# Patient Record
Sex: Male | Born: 1960
Health system: Southern US, Community
[De-identification: ages and names within clinical notes are randomized; demographics above are authoritative.]

## PROBLEM LIST (undated history)

## (undated) DIAGNOSIS — K219 Gastro-esophageal reflux disease without esophagitis: Secondary | ICD-10-CM

## (undated) DIAGNOSIS — N289 Disorder of kidney and ureter, unspecified: Secondary | ICD-10-CM

## (undated) HISTORY — PX: BACK SURGERY: SHX140

---

## 1997-08-04 ENCOUNTER — Inpatient Hospital Stay (HOSPITAL_COMMUNITY): Admission: RE | Admit: 1997-08-04 | Discharge: 1997-08-05 | Payer: Self-pay | Admitting: *Deleted

## 1999-06-20 ENCOUNTER — Encounter (INDEPENDENT_AMBULATORY_CARE_PROVIDER_SITE_OTHER): Payer: Self-pay | Admitting: Specialist

## 1999-06-20 ENCOUNTER — Ambulatory Visit (HOSPITAL_COMMUNITY): Admission: RE | Admit: 1999-06-20 | Discharge: 1999-06-20 | Payer: Self-pay | Admitting: Gastroenterology

## 2000-03-11 ENCOUNTER — Encounter: Admission: RE | Admit: 2000-03-11 | Discharge: 2000-03-11 | Payer: Self-pay | Admitting: General Surgery

## 2000-03-11 ENCOUNTER — Encounter: Payer: Self-pay | Admitting: General Surgery

## 2000-09-08 ENCOUNTER — Encounter: Payer: Self-pay | Admitting: Family Medicine

## 2000-09-08 ENCOUNTER — Encounter: Admission: RE | Admit: 2000-09-08 | Discharge: 2000-09-08 | Payer: Self-pay | Admitting: Family Medicine

## 2001-06-22 ENCOUNTER — Encounter: Payer: Self-pay | Admitting: Neurosurgery

## 2001-06-22 ENCOUNTER — Inpatient Hospital Stay (HOSPITAL_COMMUNITY): Admission: RE | Admit: 2001-06-22 | Discharge: 2001-06-25 | Payer: Self-pay | Admitting: Neurosurgery

## 2002-10-17 ENCOUNTER — Emergency Department (HOSPITAL_COMMUNITY): Admission: EM | Admit: 2002-10-17 | Discharge: 2002-10-17 | Payer: Self-pay | Admitting: Podiatry

## 2002-10-21 ENCOUNTER — Emergency Department (HOSPITAL_COMMUNITY): Admission: EM | Admit: 2002-10-21 | Discharge: 2002-10-21 | Payer: Self-pay | Admitting: Emergency Medicine

## 2002-11-03 ENCOUNTER — Ambulatory Visit (HOSPITAL_COMMUNITY): Admission: RE | Admit: 2002-11-03 | Discharge: 2002-11-03 | Payer: Self-pay | Admitting: Urology

## 2009-01-02 ENCOUNTER — Encounter: Admission: RE | Admit: 2009-01-02 | Discharge: 2009-01-02 | Payer: Self-pay | Admitting: Family Medicine

## 2010-04-02 ENCOUNTER — Other Ambulatory Visit: Payer: Self-pay | Admitting: Family Medicine

## 2010-05-31 NOTE — Procedures (Signed)
[facility]  Patient:    Gregory James, Gregory James                      MRN: [id number] Proc. Date: 06/20/99 Adm. Date:  [id number] Disc. Date: [id number] Attending:  Orland Mustard CC:         Miguel Aschoff, M.D.                           Procedure Report  PROCEDURE:  Colonoscopy and biopsy.  MEDICATIONS:  Fentanyl 125 mcg, Versed 12 mg IV.  SCOPE:  Adult Olympus videocolonoscope.  INDICATIONS:  Rectal bleeding, loose stools, low grade fever in a gentleman with a family history of Crohns disease.  DESCRIPTION OF PROCEDURE:  The procedure had been explained to the patient and consent obtained.  Patient in the left lateral decubitus position. The Olympus adult videocolonoscope inserted under direct visualization. We were able to advance easily to the cecum.  Ileocecal valve was identified. Terminal ileum was entered for a distance of approximately 10 cm.  The scope was withdrawn. The terminal ileum was completely endoscopically normal.  The scope was withdrawn.  The cecum, ascending colon, hepatic flexure, transverse colon, splenic flexure, descending and sigmoid colon were seen well. There was a small 2-3 mm single ulcer in the sigmoid colon that was biopsied.  There was no diverticular disease, no signs of any other ulcers.  Scope was withdrawn back into the rectum and retroflexed where internal hemorrhoids were seen. Scope was withdrawn.  The patient tolerated the procedure well, was maintained on low flow oxygen and pulse oximeter throughout the procedure, no obvious problem.  ASSESSMENT: 1. Sigmoid colon ulcer of questionable significance - biopsied. 2. Internal hemorrhoids. 3. No evidence clearly of Crohns disease.  PLAN:  Will see back in the office in 3-4 weeks. Check the results of the biopsy of the ulcer. DD:  06/20/99 TD:  06/24/99 Job: [id number]

## 2010-05-31 NOTE — Discharge Summary (Signed)
[place]  Patient:    Gregory James, Gregory James Visit Number: [id number] MRN: [id number]          Service Type: SUR Location: 3000 3022 01 Attending Physician:  Emeterio Reeve Dictated by:   Payton Doughty, M.D. Admit Date:  06/22/2001 Discharge Date: 06/25/2001                             Discharge Summary  ADMISSION DIAGNOSIS:  Recurring disc on left side at L4-L5.  DISCHARGE DIAGNOSES: 1. Recurring disc on left side at L4-L5. 2. Pars fracture on left at L4.  PROCEDURE:  L4-L5 laminectomy, discectomy, posterior lumbar interbody fusion with right ______ cage.  SURGEON:  Payton Doughty, M.D.  SERVICE:  Neurosurgery.  COMPLICATIONS:  None.  DISPOSITION:  Discharged in satisfactory condition.  HOSPITAL COURSE:  Patient is a 50 year old right handed white gentleman whose history and physical is recounted on the chart.  He has had two diskectomies. He has had increasing pain in his back and down his left leg.  Magnetic resonance imaging showed recurrent disc and pars fracture.  His medical history is unremarkable.  MEDICATIONS:  OxyContin, Neurontin and ibuprofen.  ALLERGIES: PENICILLIN, SULFA.  PHYSICAL EXAMINATION:   General examination was unremarkable.  NEUROLOGICAL:  Examination was intact, save for positive straight leg raising, pain, limited weakness of left leg.  The patient was admitted after ascertaining normal laboratory values and underwent a laminectomy, discectomy, posterior lumbar interbody fusion. Postoperatively the patient has done well.   He had a little bit of constipation and nausea which was taken care of in the usual fashion. Currently strength is full.  Incision is dry and well healing. He has minimal leg complaints.  MEDICATIONS:  He is being discharged home on Percocet for pain.  He can continue on his Neurontin at home.  FOLLOW UP:  His follow up will be in the [facility] office in about  10 days for suture removal. Dictated by:   Payton Doughty, M.D. Attending Physician:  Emeterio Reeve DD:  06/25/01 TD:  06/27/01 Job: [id number]

## 2010-05-31 NOTE — H&P (Signed)
[place]  Patient:    Gregory James, Gregory James Visit Number: [id number] MRN: [id number]          Service Type: SUR Location: 3000 3022 01 Attending Physician:  Emeterio Reeve Dictated by:   Payton Doughty, M.D. Admit Date:  06/22/2001                           History and Physical  ADMITTING DIAGNOSIS:  L4-5 recurrent disk on the left side.  HISTORY OF PRESENT ILLNESS:  A 50 year old right-handed white gentleman with two left-sided disks by Dr. Roxan Hockey in the 1990s.  Each time he began experiencing foot pain and back pain radiating down his left hip.  It gets to the left shin and left foot.  It has been steadily worsening.  Epidural steroids are no help.  MRI shows recurrent disk on the left at L4-5 with entrapment of the nerve root as well as a pars fracture.  PAST MEDICAL HISTORY:  Unremarkable.  No significant disease.  MEDICATIONS:  He has been using OxyContin 20 mg three times a day, Neurontin 300 mg three times a day, and ibuprofen 200 mg on a p.r.n. basis.  ALLERGIES:  PENICILLIN and SULFA and gets sick with LORTAB.  PAST SURGICAL HISTORY:  Laminectomy in 1991 and 1999.  He had a kidney stone and stent six or seven years ago.  SOCIAL HISTORY:  He does not smoke.  Drinks very rarely on a social basis.  He is an Manufacturing systems engineer.  FAMILY HISTORY:  Mom is 82 with a history of kidney stones.  Dad is deceased at an unknown age of myocardial infarction.  REVIEW OF SYSTEMS:  Remarkable for contacts.  He has occasionally had a history of atrial fibrillation, indigestion, change of bowel habits, difficulty with urination, leg weakness, back pain, with difficulty of coordination in his legs.  PHYSICAL EXAMINATION:  HEENT:  Within normal limits.  NECK:  He has reasonable range of motion of the neck.  CHEST:  Clear.  CARDIAC:  Regular rate and rhythm.  ABDOMEN:  Nontender, no hepatosplenomegaly.  EXTREMITIES:  Without clubbing  cyanosis.  GENITOURINARY:  Deferred.  VASCULAR:  Peripheral pulses are good.  NEUROLOGIC:  He is awake, alert and oriented.  His cranial nerves are intact. Motor exam shows 5/5 strength throughout the upper extremities and the right lower extremity.  In the left lower extremity he has pain and weakness in the hip flexors and knee extensors.  Sensory deficit described in the left L4 and L5 distribution.  Straight leg raise is positive on the left.  Reverse is not. Knee jerks are 2 on the right, flicker on the left, and ankle jerks are 1 bilaterally.  LABORATORY DATA:  MRI shows laminectomy defect at 4-5 as well as disk recurrence, which is somewhat small but with significant entrapment of the left L4 root under what I believe to be a pars fracture.  CLINICAL IMPRESSION:  Left L4 radiculopathy as well as an L5 radiculopathy related to disk disease at L4-5.  PLAN:  Lumbar laminectomy, diskectomy, and posterior lumbar interbody fusion at L4-5.  The risks and benefits of this approach were discussed with him, and he wished to proceed. Dictated by:   Payton Doughty, M.D. Attending Physician:  Emeterio Reeve DD:  06/22/01 TD:  06/24/01 Job: [id number]

## 2010-05-31 NOTE — Op Note (Signed)
[place]  Patient:    Gregory James, Gregory James Visit Number: [id number] MRN: [id number]          Service Type: SUR Location: 3000 3022 01 Attending Physician:  Emeterio Reeve Dictated by:   Payton Doughty, M.D. Proc. Date: 06/22/01 Admit Date:  06/22/2001                             Operative Report  PREOPERATIVE DIAGNOSIS:  Recurrent herniated disk on the left side at L4-5.  POSTOPERATIVE DIAGNOSES: 1. Recurrent herniated disk on the left side at L4-5. 2. Left L4 pars fracture.  PROCEDURE:  L4-5 laminectomy, diskectomy, posterior lumbar interbody fusion with a Ray Threaded Fusion Cage.  ANESTHESIA:  General endotracheal.  PREPARATION:  Sterile Betadine prep and scrub with alcohol wipe.  COMPLICATIONS:  None.  SURGEON:  Payton Doughty, M.D.  DESCRIPTION OF PROCEDURE:  This is a 50 year old gentleman with severe left L4 and L5 radiculopathy.  He has had two lumbar diskectomies at 4-5 in the remote past.  He was taken to the operating room and smoothly anesthetized and intubated, placed prone on the operating table.  Following shave, prep, and drape in the usual sterile fashion, the skin was infiltrated with 1% lidocaine with 1:400,000 epinephrine.  The old skin incision was reopened and the skin was incised from the top of L4 to mid-L5, and the laminae of L4 were exposed bilaterally in the subperiosteal plane.  Intraoperative x-ray confirmed correctness of the level.  The pars interarticularis on the left side was found to be fractured.  Removing the remaining lamina, pars, and inferior facet of L4 and the superior facet of L5 bilaterally allowed complete dissection of the 4 roots and the 5 roots as they rounded their respective pedicles.  In addition to the L4 fracture, there was a large ruptured disk at L4-5 on the left side.  This was removed without difficulty, resulting in decompression of the 4 and the 5 roots.  Following complete  decompression of the roots, the Ray Threaded Fusion Cages were placed, 14 x 26 mm. Intraoperative x-ray showed good placement of the cages.  They were packed with bone graft harvested from the facet joints.  We then closed the incision with successive layers of 0 Vicryl in the fascia and subcutaneous tissue, and the subcuticular tissue was reapproximated with 3-0 Vicryl in interrupted fashion.  Skin was closed with 3-0 nylon in a running locked fashion. Bacitracin and Telfa dressing was applied and made occlusive with OpSite.  The patient returned to the recovery room in good condition. Dictated by:   Payton Doughty, M.D. Attending Physician:  Emeterio Reeve DD:  06/22/01 TD:  06/24/01 Job: [id number]

## 2011-07-23 IMAGING — CT CT ABDOMEN W/O CM
2 of 4 series · 17 of 46 positions shown, 19 images · non-contrast
Comparison: None.

CT ABDOMEN

CLINICAL DATA: Abdominal pain.  History renal stones.
Lithotripsy.

CT OF THE ABDOMEN AND PELVIS WITHOUT CONTRAST
TECHNIQUE: Multidetector CT imaging was performed through the
abdomen and pelvis to include the urinary tract.

[Series 2: wo · axial · 0.91mm/px · z∈[+617,+1102]mm · 14 of 107 slices shown, 16 images]
[im 5/107  soft-tissue]
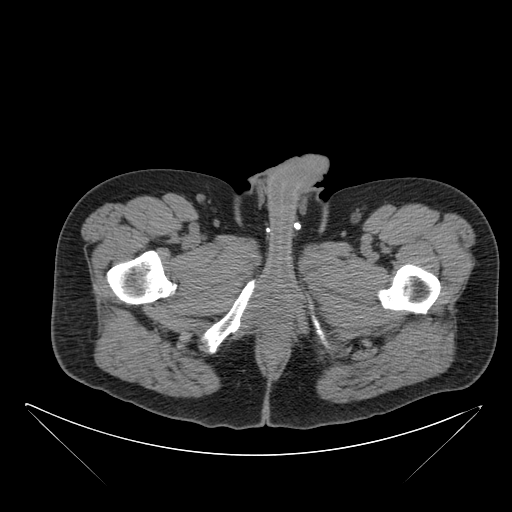
[im 5/107  bone]
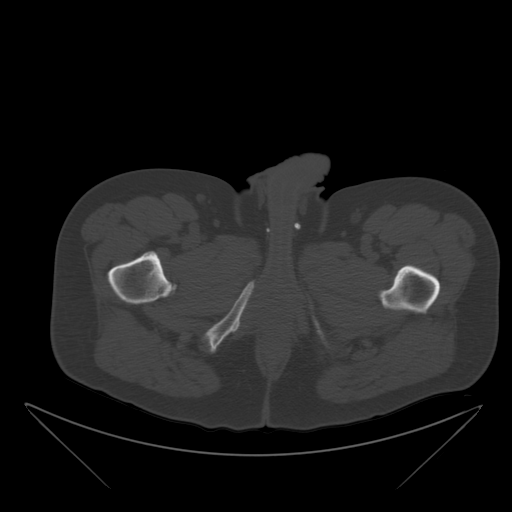
[im 13/107  soft-tissue]
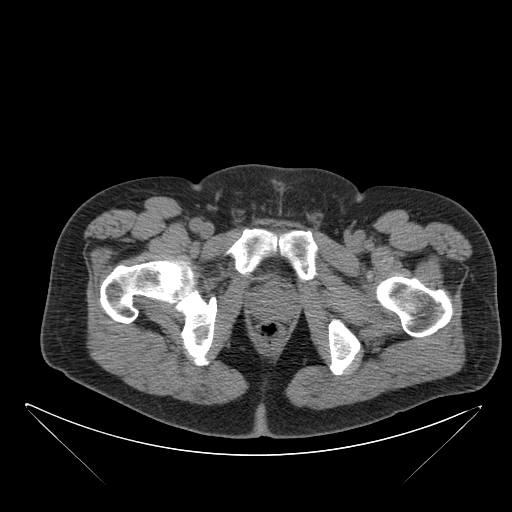
[im 22/107  soft-tissue]
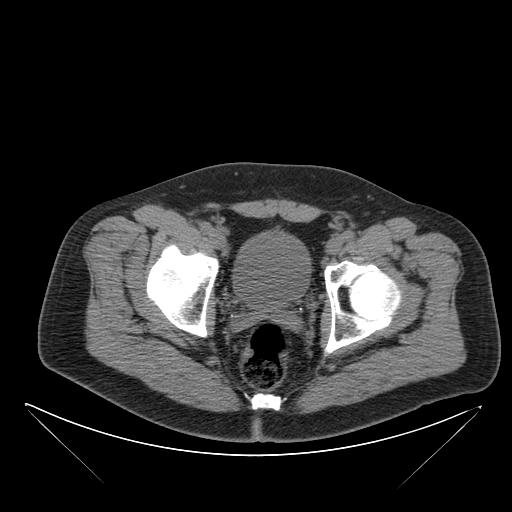
[im 30/107  soft-tissue]
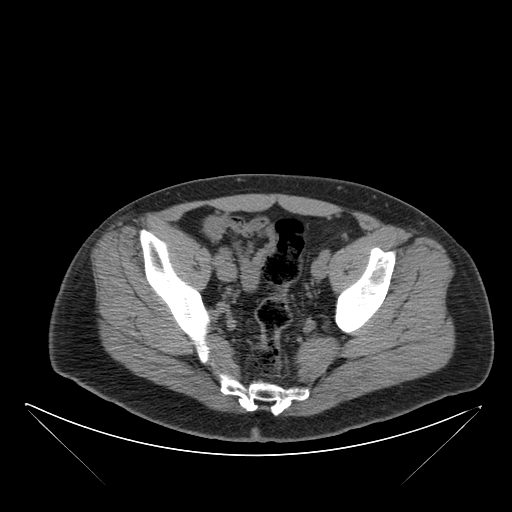
[im 34/107  soft-tissue]
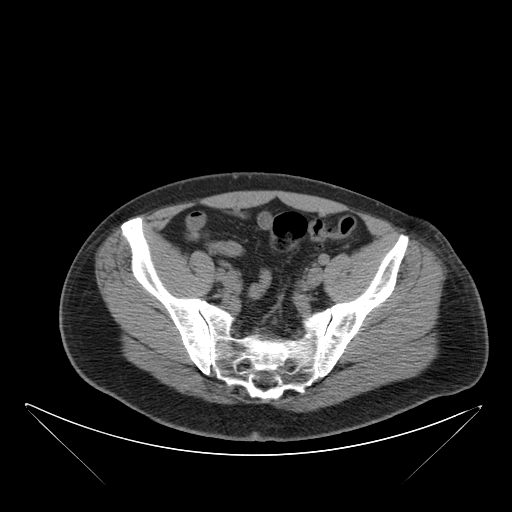
[im 43/107  soft-tissue]
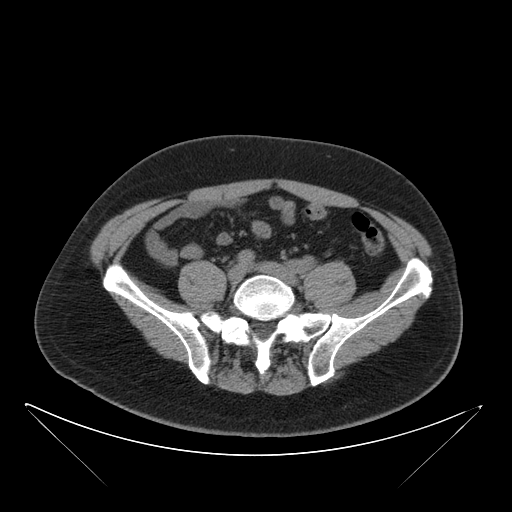
[im 51/107  soft-tissue]
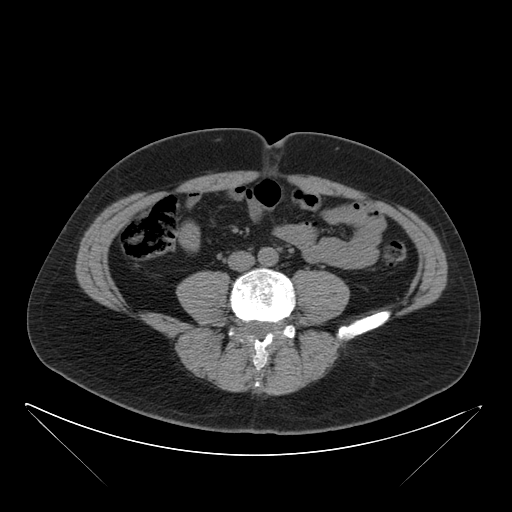
[im 56/107  soft-tissue]
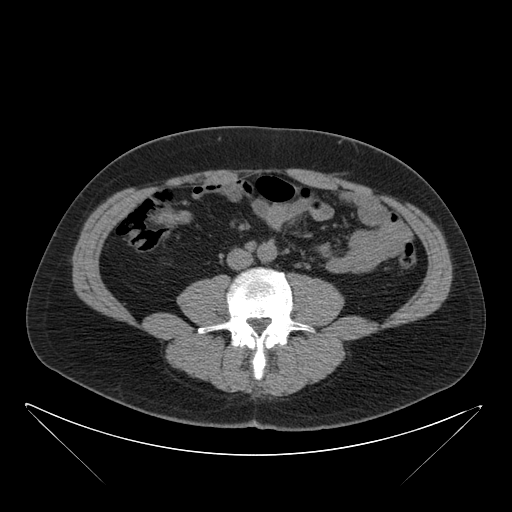
[im 64/107  soft-tissue]
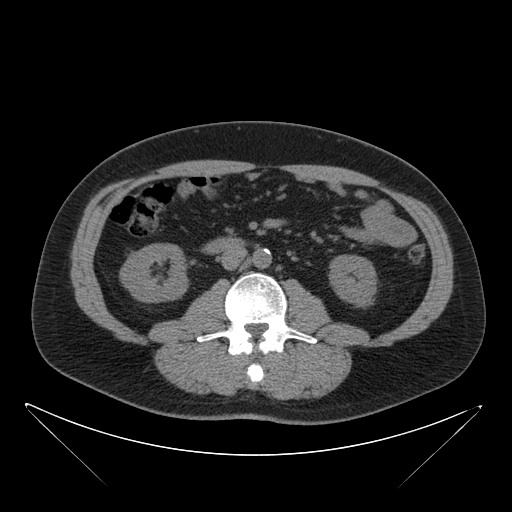
[im 64/107  bone]
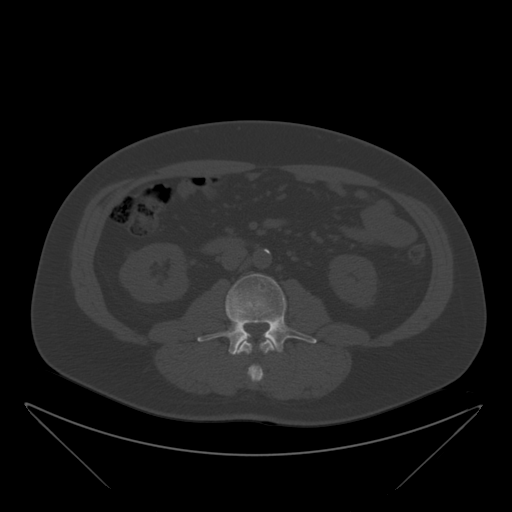
[im 73/107  soft-tissue]
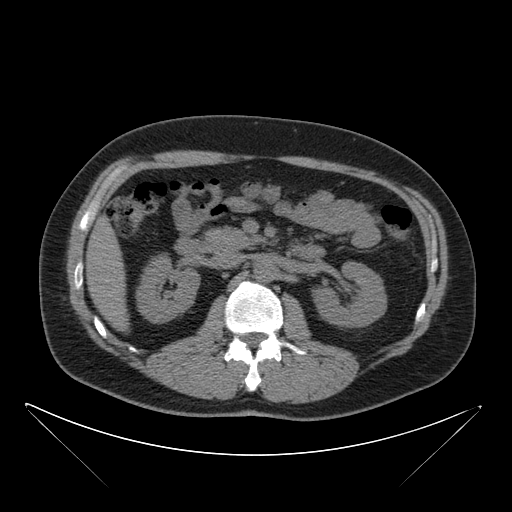
[im 81/107  soft-tissue]
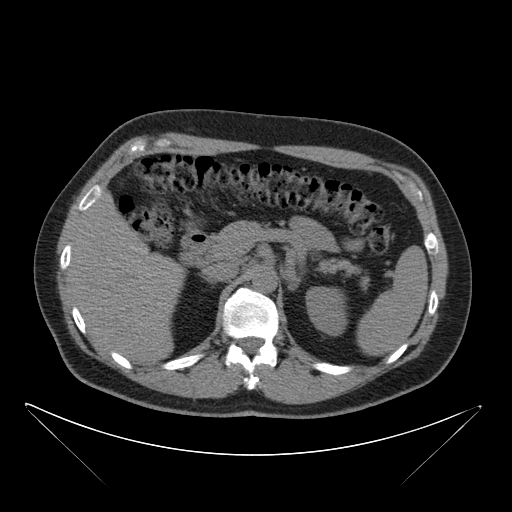
[im 85/107  soft-tissue]
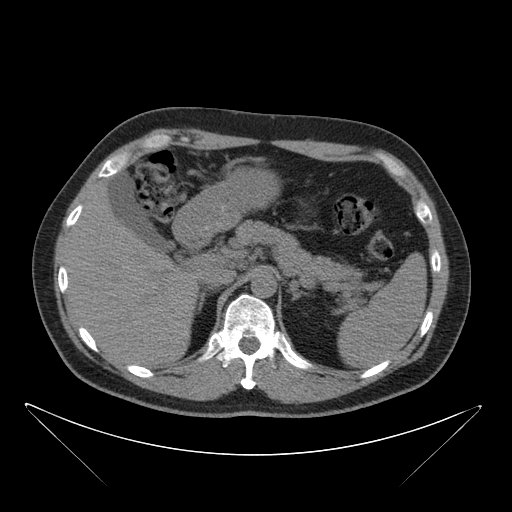
[im 94/107  soft-tissue]
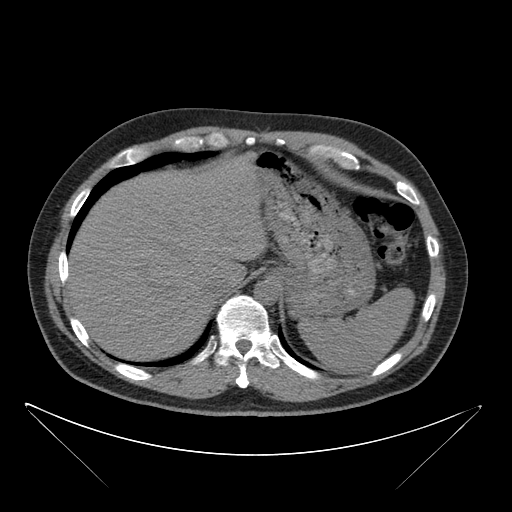
[im 102/107  soft-tissue]
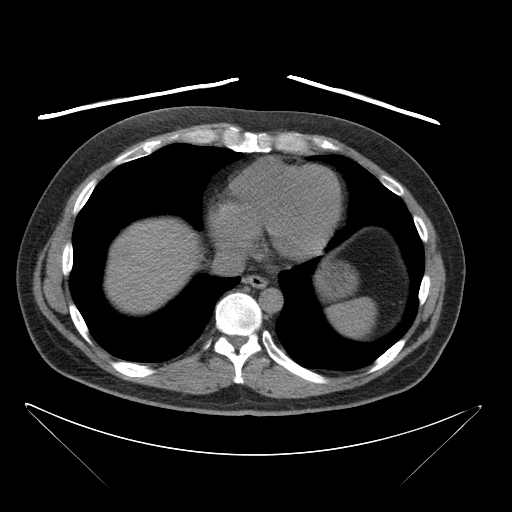

[coronals · coronal · 1.03mm/px · 3 of 134 slices shown]
[im 45/134  soft-tissue]
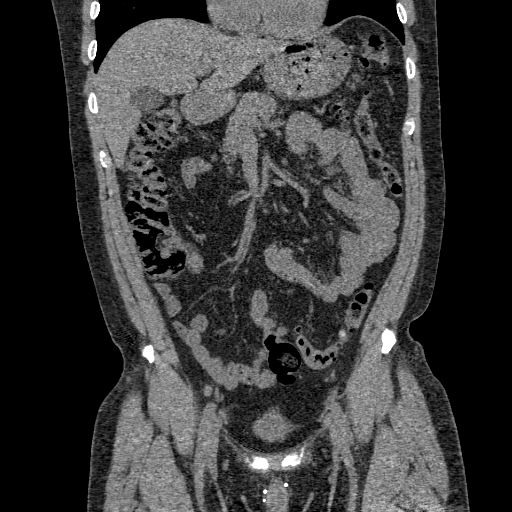
[im 60/134  soft-tissue]
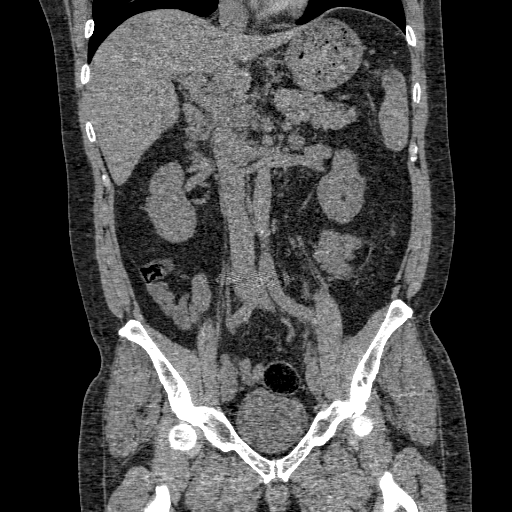
[im 74/134  soft-tissue]
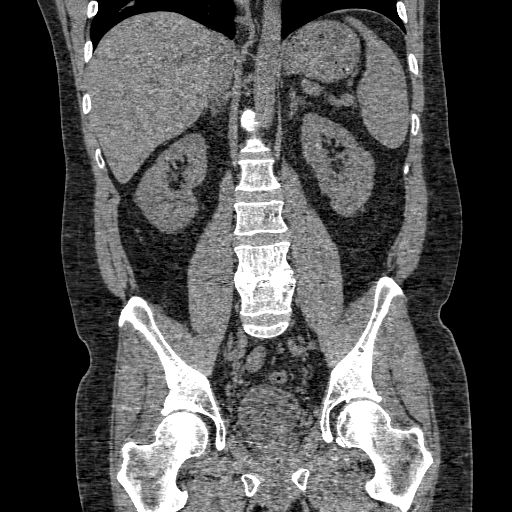

[17 of 46 positions shown; findings below may reference images not displayed]

FINDINGS: Negative liver, spleen, pancreas, kidneys, and adrenal
glands.  No urinary tract obstruction.  No hydronephrosis or
hydroureter.  No unusual bowel wall thickening.  No free fluid.  No
acute mesenteric or retroperitoneal abnormality.  Sclerotic density
in the L3 vertebral body compatible with a benign bone island.
Stable appearing  L4-5 interbody fusion with paired fusion cages.
IMPRESSION: Unremarkable abdominal CT scan.  No urinary tract calculi.  No
obstructive uropathy.

CT PELVIS
FINDINGS: No distal ureteral dilatation.  No ureteral or bladder
calculi.  Bladder, prostate gland, and seminal vesicles appear
unremarkable.  Bony pelvis intact.
IMPRESSION: Negative CT pelvis.

## 2013-10-12 ENCOUNTER — Other Ambulatory Visit: Payer: Self-pay | Admitting: Family Medicine

## 2013-10-12 ENCOUNTER — Ambulatory Visit
Admission: RE | Admit: 2013-10-12 | Discharge: 2013-10-12 | Disposition: A | Discharge status: Home / Self Care | Payer: BC Managed Care – PPO | Source: Ambulatory Visit | Attending: Family Medicine | Admitting: Family Medicine

## 2013-10-12 DIAGNOSIS — R05 Cough: Secondary | ICD-10-CM

## 2013-10-12 DIAGNOSIS — R059 Cough, unspecified: Secondary | ICD-10-CM

## 2015-06-22 DIAGNOSIS — Z0000 Encounter for general adult medical examination without abnormal findings: Secondary | ICD-10-CM | POA: Diagnosis not present

## 2015-08-03 DIAGNOSIS — H5203 Hypermetropia, bilateral: Secondary | ICD-10-CM | POA: Diagnosis not present

## 2015-08-03 DIAGNOSIS — H524 Presbyopia: Secondary | ICD-10-CM | POA: Diagnosis not present

## 2015-10-22 DIAGNOSIS — Z0000 Encounter for general adult medical examination without abnormal findings: Secondary | ICD-10-CM | POA: Diagnosis not present

## 2015-10-22 DIAGNOSIS — Z23 Encounter for immunization: Secondary | ICD-10-CM | POA: Diagnosis not present

## 2015-10-22 DIAGNOSIS — E291 Testicular hypofunction: Secondary | ICD-10-CM | POA: Diagnosis not present

## 2015-11-30 DIAGNOSIS — E291 Testicular hypofunction: Secondary | ICD-10-CM | POA: Diagnosis not present

## 2016-05-01 IMAGING — CR DG CHEST 2V
2 series · 2 of 2 positions shown · non-contrast
Comparison: PA and lateral chest 01/29/2011.

CLINICAL DATA: Cough for 1 month.

EXAM:
CHEST  2 VIEW

[view not recorded (1 of 2)]
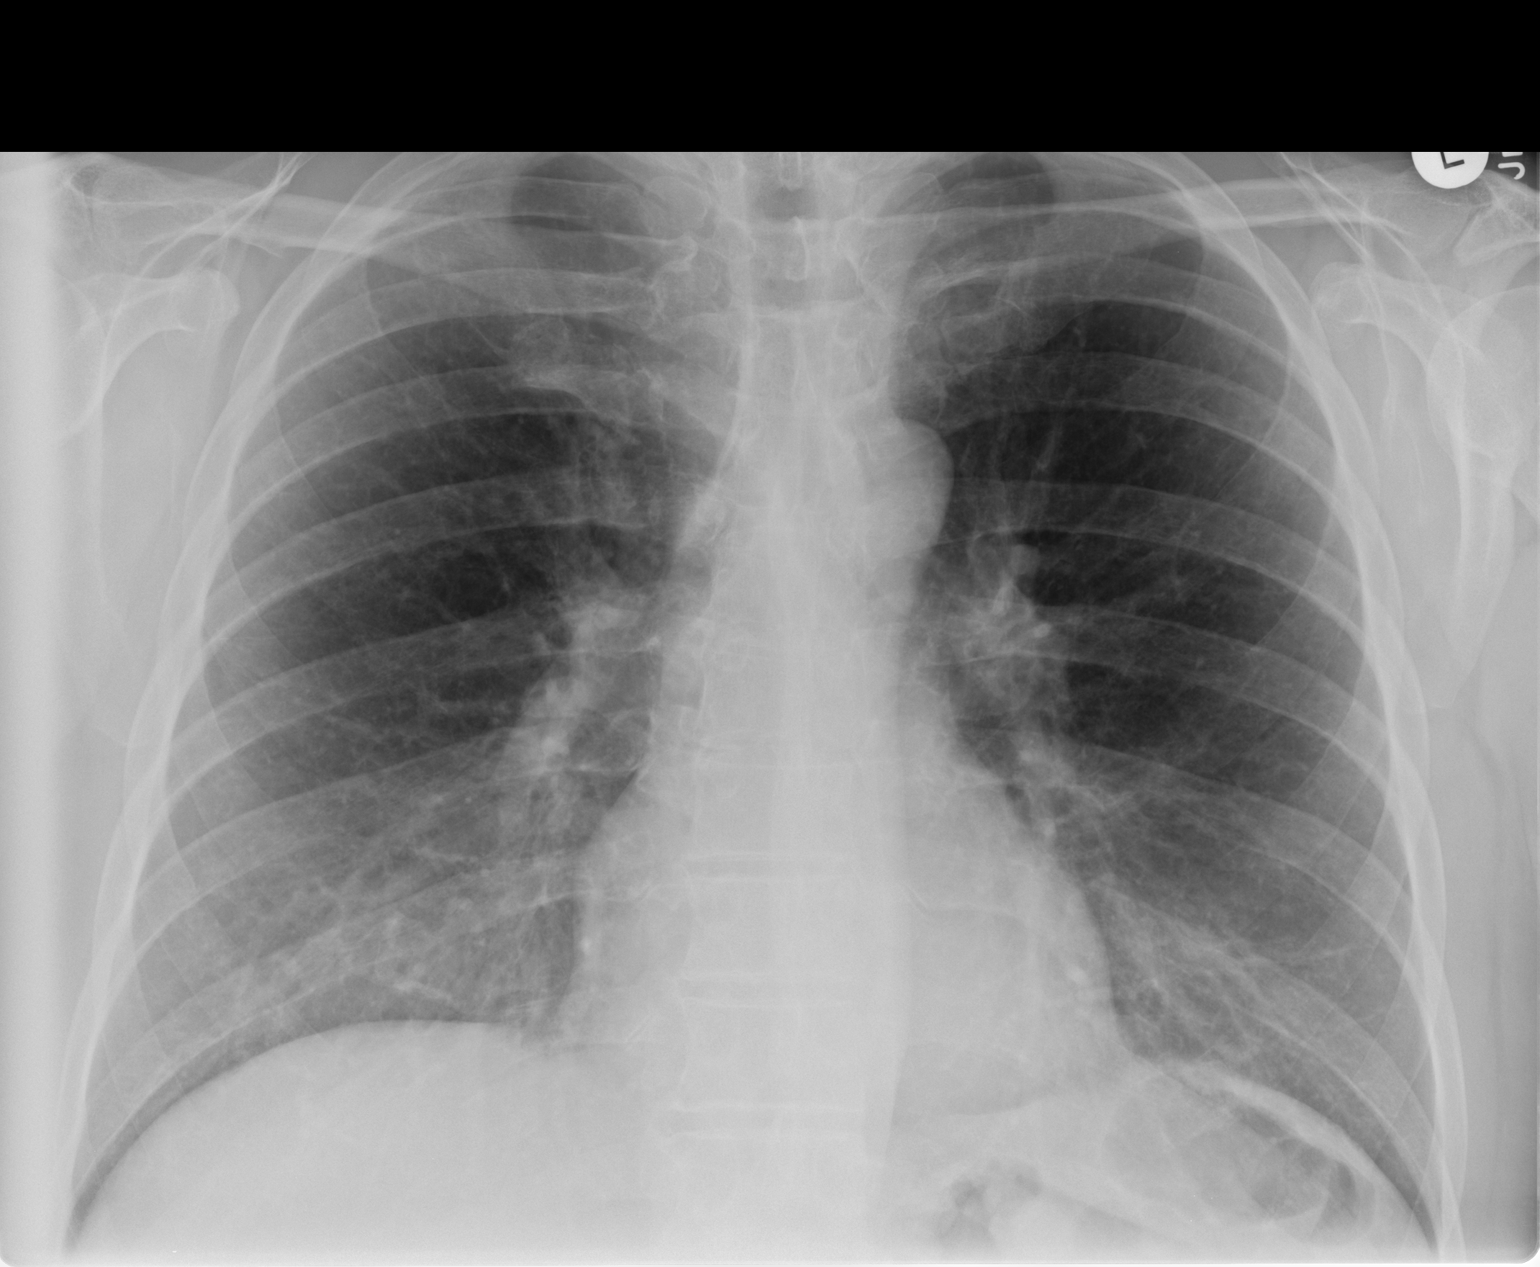

[view not recorded (2 of 2)]
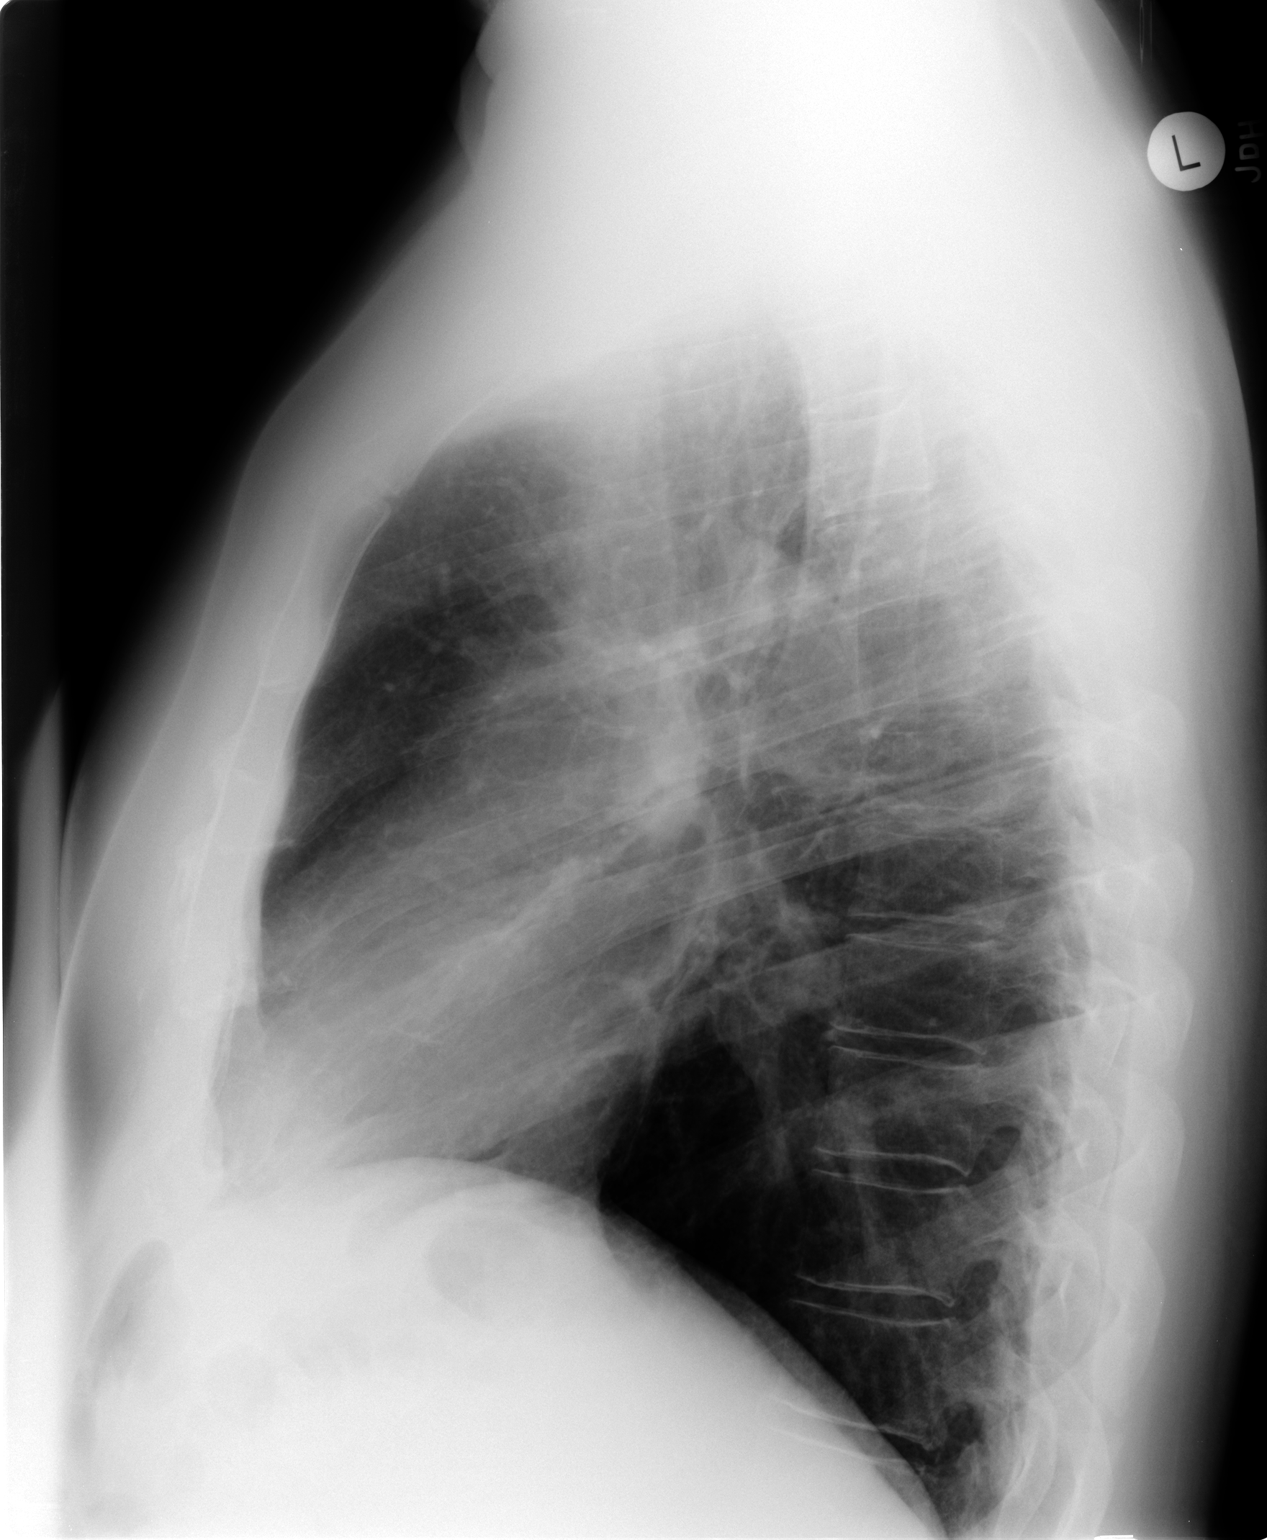

[2 of 2 positions shown; findings below may reference images not displayed]

FINDINGS: The lungs are clear. Heart size is normal. There is no pneumothorax
or pleural effusion. No focal bony abnormality.
IMPRESSION: Negative chest.

## 2016-05-30 DIAGNOSIS — E291 Testicular hypofunction: Secondary | ICD-10-CM | POA: Diagnosis not present

## 2016-05-30 DIAGNOSIS — Z79899 Other long term (current) drug therapy: Secondary | ICD-10-CM | POA: Diagnosis not present

## 2016-10-06 DIAGNOSIS — H524 Presbyopia: Secondary | ICD-10-CM | POA: Diagnosis not present

## 2016-10-06 DIAGNOSIS — H5203 Hypermetropia, bilateral: Secondary | ICD-10-CM | POA: Diagnosis not present

## 2016-11-28 DIAGNOSIS — Z0000 Encounter for general adult medical examination without abnormal findings: Secondary | ICD-10-CM | POA: Diagnosis not present

## 2016-11-28 DIAGNOSIS — Z23 Encounter for immunization: Secondary | ICD-10-CM | POA: Diagnosis not present

## 2016-11-28 DIAGNOSIS — Z125 Encounter for screening for malignant neoplasm of prostate: Secondary | ICD-10-CM | POA: Diagnosis not present

## 2016-11-28 DIAGNOSIS — E291 Testicular hypofunction: Secondary | ICD-10-CM | POA: Diagnosis not present

## 2016-11-28 DIAGNOSIS — Z13220 Encounter for screening for lipoid disorders: Secondary | ICD-10-CM | POA: Diagnosis not present

## 2017-07-21 ENCOUNTER — Encounter (HOSPITAL_COMMUNITY): Payer: Self-pay | Admitting: Emergency Medicine

## 2017-07-21 ENCOUNTER — Other Ambulatory Visit: Payer: Self-pay

## 2017-07-21 ENCOUNTER — Emergency Department (HOSPITAL_COMMUNITY)
Admission: EM | Admit: 2017-07-21 | Discharge: 2017-07-21 | Disposition: A | Discharge status: Home / Self Care | Payer: BLUE CROSS/BLUE SHIELD | Attending: Emergency Medicine | Admitting: Emergency Medicine

## 2017-07-21 DIAGNOSIS — N23 Unspecified renal colic: Secondary | ICD-10-CM | POA: Diagnosis not present

## 2017-07-21 DIAGNOSIS — R1084 Generalized abdominal pain: Secondary | ICD-10-CM | POA: Diagnosis not present

## 2017-07-21 HISTORY — DX: Disorder of kidney and ureter, unspecified: N28.9

## 2017-07-21 LAB — CBC WITH DIFFERENTIAL/PLATELET
Basophils Absolute: 0 10*3/uL (ref 0.0–0.1)
Basophils Relative: 0 %
Eosinophils Absolute: 0.1 10*3/uL (ref 0.0–0.7)
Eosinophils Relative: 1 %
HEMATOCRIT: 46.8 % (ref 39.0–52.0)
HEMOGLOBIN: 15.8 g/dL (ref 13.0–17.0)
LYMPHS ABS: 0.7 10*3/uL (ref 0.7–4.0)
LYMPHS PCT: 10 %
MCH: 32.8 pg (ref 26.0–34.0)
MCHC: 33.8 g/dL (ref 30.0–36.0)
MCV: 97.3 fL (ref 78.0–100.0)
MONOS PCT: 8 %
Monocytes Absolute: 0.5 10*3/uL (ref 0.1–1.0)
NEUTROS ABS: 5.5 10*3/uL (ref 1.7–7.7)
NEUTROS PCT: 81 %
Platelets: 160 10*3/uL (ref 150–400)
RBC: 4.81 MIL/uL (ref 4.22–5.81)
RDW: 13.2 % (ref 11.5–15.5)
WBC: 6.8 10*3/uL (ref 4.0–10.5)

## 2017-07-21 LAB — URINALYSIS, ROUTINE W REFLEX MICROSCOPIC
Bacteria, UA: NONE SEEN
Bilirubin Urine: NEGATIVE
Glucose, UA: NEGATIVE mg/dL
Ketones, ur: NEGATIVE mg/dL
Leukocytes, UA: NEGATIVE
Nitrite: NEGATIVE
Protein, ur: NEGATIVE mg/dL
RBC / HPF: >50 RBC/hpf — ABNORMAL HIGH (ref 0–5)
Specific Gravity, Urine: 1.015 (ref 1.005–1.030)
pH: 8 (ref 5.0–8.0)

## 2017-07-21 LAB — BASIC METABOLIC PANEL
Anion gap: 5 (ref 5–15)
BUN: 13 mg/dL (ref 6–20)
CHLORIDE: 108 mmol/L (ref 98–111)
CO2: 29 mmol/L (ref 22–32)
CREATININE: 1.07 mg/dL (ref 0.61–1.24)
Calcium: 8.7 mg/dL — ABNORMAL LOW (ref 8.9–10.3)
GFR calc Af Amer: >60 mL/min (ref >60)
GFR calc non Af Amer: >60 mL/min (ref >60)
GLUCOSE: 109 mg/dL — AB (ref 70–99)
POTASSIUM: 4.2 mmol/L (ref 3.5–5.1)
Sodium: 142 mmol/L (ref 135–145)

## 2017-07-21 MED ORDER — OXYCODONE-ACETAMINOPHEN 5-325 MG PO TABS: 1.0000 | ORAL_TABLET | Freq: Four times a day (QID) | ORAL | 0 refills | Status: DC | PRN

## 2017-07-21 MED ORDER — ONDANSETRON HCL 4 MG PO TABS: 4.0000 mg | ORAL_TABLET | Freq: Four times a day (QID) | ORAL | 0 refills | Status: DC | PRN

## 2017-07-21 NOTE — ED Triage Notes (Signed)
Pt reports having left sided flank pain that started on 07/18/17 and has progressively gotten worse. Pt reports prior hx of Kidney stones.

## 2017-07-21 NOTE — ED Provider Notes (Signed)
[facility]-EMERGENCY DEPT Provider Note   CSN: [id number] Arrival date & time: 07/21/17  0553     History   Chief Complaint Chief Complaint  Patient presents with  . Flank Pain    HPI Gregory James is a 57 y.o. male.  HPI   57yM with L flank pain.  He had mild aching back pain 2 days ago.  It got acutely worse this morning.  Pain is sharp.  Cannot find comfortable position.  Nauseated and diaphoretic.  He has a past history of kidney stones and says that this pain felt similar.  He actually now feels markedly better and really is not having much pain at all.  He has no specific urinary complaints.  He denies any fevers.  Past Medical History:  Diagnosis Date  . Renal disorder     There are no active problems to display for this patient.   Past Surgical History:  Procedure Laterality Date  . BACK SURGERY          Home Medications    Prior to Admission medications   Medication Sig Start Date End Date Taking? Authorizing Provider  ondansetron (ZOFRAN) 4 MG tablet Take 1 tablet (4 mg total) by mouth every 6 (six) hours as needed for nausea or vomiting. 07/21/17   Raeford Razor, MD  oxyCODONE-acetaminophen (PERCOCET/ROXICET) 5-325 MG tablet Take 1-2 tablets by mouth every 6 (six) hours as needed for severe pain. 07/21/17   Raeford Razor, MD    Family History History reviewed. No pertinent family history.  Social History Social History   Tobacco Use  . Smoking status: Never Smoker  . Smokeless tobacco: Never Used  Substance Use Topics  . Alcohol use: Yes    Frequency: Never  . Drug use: Never     Allergies   Lortab [hydrocodone-acetaminophen]; Penicillins; and Sulfa antibiotics   Review of Systems Review of Systems  All systems reviewed and negative, other than as noted in HPI.  Physical Exam Updated Vital Signs BP 137/88 (BP Location: Left Arm)   Pulse 73   Temp 98.4 F (36.9 C) (Oral)   Resp 18   Ht 6\' 2"  (1.88 m)   Wt  108.9 kg (240 lb)   SpO2 98%   BMI 30.81 kg/m   Physical Exam  Constitutional: He appears well-developed and well-nourished. No distress.  HENT:  Head: Normocephalic and atraumatic.  Eyes: Conjunctivae are normal. Right eye exhibits no discharge. Left eye exhibits no discharge.  Neck: Neck supple.  Cardiovascular: Normal rate, regular rhythm and normal heart sounds. Exam reveals no gallop and no friction rub.  No murmur heard. Pulmonary/Chest: Effort normal and breath sounds normal. No respiratory distress.  Abdominal: Soft. He exhibits no distension. There is no tenderness.  Musculoskeletal: He exhibits no edema or tenderness.  Neurological: He is alert.  Skin: Skin is warm and dry.  Psychiatric: He has a normal mood and affect. His behavior is normal. Thought content normal.  Nursing note and vitals reviewed.    ED Treatments / Results  Labs (all labs ordered are listed, but only abnormal results are displayed) Labs Reviewed  URINALYSIS, ROUTINE W REFLEX MICROSCOPIC - Abnormal; Notable for the following components:      Result Value   Hgb urine dipstick LARGE (*)    RBC / HPF >50 (*)    All other components within normal limits  BASIC METABOLIC PANEL - Abnormal; Notable for the following components:   Glucose, Bld 109 (*)  Calcium 8.7 (*)    All other components within normal limits  CBC WITH DIFFERENTIAL/PLATELET    EKG None  Radiology No results found.  Procedures Procedures (including critical care time)  Medications Ordered in ED Medications - No data to display   Initial Impression / Assessment and Plan / ED Course  I have reviewed the triage vital signs and the nursing notes.  Pertinent labs & imaging results that were available during my care of the patient were reviewed by me and considered in my medical decision making (see chart for details).     57 year old male with flank pain/back pain.  His symptoms do seem consistent with ureteral colic.   He is now more or less symptom-free.  He is afebrile and well-appearing.  Blood noted on UA which would further support this diagnosis.  I do not feel that there is much role for imaging at this time.  He will be prescribed some pain medicine to take as needed if his pain should return.  Emergent return precautions were discussed.  He has establish urologic care if needed.  Final Clinical Impressions(s) / ED Diagnoses   Final diagnoses:  Ureteral colic    ED Discharge Orders        Ordered    oxyCODONE-acetaminophen (PERCOCET/ROXICET) 5-325 MG tablet  Every 6 hours PRN     07/21/17 0749    ondansetron (ZOFRAN) 4 MG tablet  Every 6 hours PRN     07/21/17 0749       Raeford RazorKohut, Maxyne Derocher, MD 07/26/17 1137

## 2017-07-21 NOTE — Discharge Instructions (Signed)
Take ibuprofen 600 mg every 6 hours as needed for pain. Return to ER for uncontrolled pain/nausea or fever. Follow-up with urology if having continued symptoms beyond 2-3 days even if tolerable.

## 2017-07-21 NOTE — ED Notes (Signed)
Urine culture in lab if needed.  

## 2017-07-27 DIAGNOSIS — N202 Calculus of kidney with calculus of ureter: Secondary | ICD-10-CM | POA: Diagnosis not present

## 2017-07-27 DIAGNOSIS — N200 Calculus of kidney: Secondary | ICD-10-CM | POA: Diagnosis not present

## 2017-08-17 DIAGNOSIS — N202 Calculus of kidney with calculus of ureter: Secondary | ICD-10-CM | POA: Diagnosis not present

## 2017-10-09 DIAGNOSIS — H0100B Unspecified blepharitis left eye, upper and lower eyelids: Secondary | ICD-10-CM | POA: Diagnosis not present

## 2017-10-09 DIAGNOSIS — H524 Presbyopia: Secondary | ICD-10-CM | POA: Diagnosis not present

## 2017-10-09 DIAGNOSIS — H5203 Hypermetropia, bilateral: Secondary | ICD-10-CM | POA: Diagnosis not present

## 2017-10-09 DIAGNOSIS — H0100A Unspecified blepharitis right eye, upper and lower eyelids: Secondary | ICD-10-CM | POA: Diagnosis not present

## 2017-12-22 DIAGNOSIS — Z0000 Encounter for general adult medical examination without abnormal findings: Secondary | ICD-10-CM | POA: Diagnosis not present

## 2017-12-22 DIAGNOSIS — E291 Testicular hypofunction: Secondary | ICD-10-CM | POA: Diagnosis not present

## 2017-12-22 DIAGNOSIS — E7800 Pure hypercholesterolemia, unspecified: Secondary | ICD-10-CM | POA: Diagnosis not present

## 2017-12-23 DIAGNOSIS — Z0000 Encounter for general adult medical examination without abnormal findings: Secondary | ICD-10-CM | POA: Diagnosis not present

## 2018-12-17 DIAGNOSIS — H0100A Unspecified blepharitis right eye, upper and lower eyelids: Secondary | ICD-10-CM | POA: Diagnosis not present

## 2018-12-17 DIAGNOSIS — H5203 Hypermetropia, bilateral: Secondary | ICD-10-CM | POA: Diagnosis not present

## 2018-12-17 DIAGNOSIS — H524 Presbyopia: Secondary | ICD-10-CM | POA: Diagnosis not present

## 2018-12-17 DIAGNOSIS — H0100B Unspecified blepharitis left eye, upper and lower eyelids: Secondary | ICD-10-CM | POA: Diagnosis not present

## 2019-03-25 ENCOUNTER — Ambulatory Visit: Payer: BC Managed Care – PPO | Attending: Internal Medicine

## 2019-03-25 DIAGNOSIS — Z23 Encounter for immunization: Secondary | ICD-10-CM

## 2019-03-25 NOTE — Progress Notes (Signed)
   Covid-19 Vaccination Clinic  Name:  Gregory James    MRN: [id number] DOB: 1960/09/28  03/25/2019  [name] was observed post Covid-19 immunization for 15 minutes without incident. He was provided with Vaccine Information Sheet and instruction to access the V-Safe system.   [name] was instructed to call 911 with any severe reactions post vaccine: Marland Kitchen Difficulty breathing  . Swelling of face and throat  . A fast heartbeat  . A bad rash all over body  . Dizziness and weakness   Immunizations Administered    Name Date Dose VIS Date Route   Pfizer COVID-19 Vaccine 03/25/2019 12:42 PM 0.3 mL 12/24/2018 Intramuscular   Manufacturer: [employer], [employer]   Lot: [id number]   NDC: [address]-5

## 2019-04-19 ENCOUNTER — Ambulatory Visit: Payer: BC Managed Care – PPO | Attending: Internal Medicine

## 2019-04-19 DIAGNOSIS — Z23 Encounter for immunization: Secondary | ICD-10-CM

## 2019-04-19 NOTE — Progress Notes (Signed)
   Covid-19 Vaccination Clinic  Name:  Gregory James    MRN: [id number] DOB: 19-Jan-1960  04/19/2019  [name] was observed post Covid-19 immunization for    Covid-19 Vaccination Clinic  Name:  Gregory James    MRN: [id number] DOB: [birth year]  04/19/2019  [name] was observed post Covid-19 immunization for 15 minutes without incident. He was provided with Vaccine Information Sheet and instruction to access the V-Safe system.   [name] was instructed to call 911 with any severe reactions post vaccine: Marland Kitchen Difficulty breathing  . Swelling of face and throat  . A fast heartbeat  . A bad rash all over body  . Dizziness and weakness   Immunizations Administered    Name Date Dose VIS Date Route   Pfizer COVID-19 Vaccine 04/19/2019  9:00 AM 0.3 mL 12/24/2018 Intramuscular   Manufacturer: [employer], [employer]   Lot: [id number]   NDC: [address]-4     without incident. He was provided with Vaccine Information Sheet and instruction to access the V-Safe system.   [name] was instructed to call 911 with any severe reactions post vaccine: Marland Kitchen Difficulty breathing  . Swelling of face and throat  . A fast heartbeat  . A bad rash all over body  . Dizziness and weakness   Immunizations Administered    Name Date Dose VIS Date Route   Pfizer COVID-19 Vaccine 04/19/2019  9:00 AM 0.3 mL 12/24/2018 Intramuscular   Manufacturer: [employer], [employer]   Lot: [id number]   NDC: [address]-1      Covid-[facility] Vaccination Clinic  Name:  Gregory James    MRN: [id number] DOB: 09-09-1960  04/19/2019  [name] was observed post Covid-19 immunization for 15 minutes without incident. He was provided with Vaccine Information Sheet and instruction to access the V-Safe system.   [name] was instructed to call 911 with any severe reactions post vaccine: Marland Kitchen Difficulty breathing  . Swelling of face and throat  . A fast heartbeat  . A bad rash all over body  . Dizziness and weakness   Immunizations Administered     Name Date Dose VIS Date Route   Pfizer COVID-19 Vaccine 04/19/2019  9:00 AM 0.3 mL 12/24/2018 Intramuscular   Manufacturer: [employer], [employer]   Lot: [id number]   NDC: [address]-6

## 2019-06-07 DIAGNOSIS — Z0000 Encounter for general adult medical examination without abnormal findings: Secondary | ICD-10-CM | POA: Diagnosis not present

## 2019-06-07 DIAGNOSIS — Z125 Encounter for screening for malignant neoplasm of prostate: Secondary | ICD-10-CM | POA: Diagnosis not present

## 2019-06-07 DIAGNOSIS — Z13220 Encounter for screening for lipoid disorders: Secondary | ICD-10-CM | POA: Diagnosis not present

## 2019-06-07 DIAGNOSIS — E291 Testicular hypofunction: Secondary | ICD-10-CM | POA: Diagnosis not present

## 2019-06-14 DIAGNOSIS — K5190 Ulcerative colitis, unspecified, without complications: Secondary | ICD-10-CM | POA: Diagnosis not present

## 2019-06-14 DIAGNOSIS — Z1211 Encounter for screening for malignant neoplasm of colon: Secondary | ICD-10-CM | POA: Diagnosis not present

## 2019-06-14 DIAGNOSIS — K5730 Diverticulosis of large intestine without perforation or abscess without bleeding: Secondary | ICD-10-CM | POA: Diagnosis not present

## 2019-07-15 DIAGNOSIS — K5190 Ulcerative colitis, unspecified, without complications: Secondary | ICD-10-CM | POA: Diagnosis not present

## 2019-07-15 DIAGNOSIS — K6389 Other specified diseases of intestine: Secondary | ICD-10-CM | POA: Diagnosis not present

## 2019-07-15 DIAGNOSIS — Z1211 Encounter for screening for malignant neoplasm of colon: Secondary | ICD-10-CM | POA: Diagnosis not present

## 2019-07-15 DIAGNOSIS — D122 Benign neoplasm of ascending colon: Secondary | ICD-10-CM | POA: Diagnosis not present

## 2019-07-15 DIAGNOSIS — D125 Benign neoplasm of sigmoid colon: Secondary | ICD-10-CM | POA: Diagnosis not present

## 2019-07-15 DIAGNOSIS — K635 Polyp of colon: Secondary | ICD-10-CM | POA: Diagnosis not present

## 2019-12-30 DIAGNOSIS — H0100B Unspecified blepharitis left eye, upper and lower eyelids: Secondary | ICD-10-CM | POA: Diagnosis not present

## 2019-12-30 DIAGNOSIS — H0100A Unspecified blepharitis right eye, upper and lower eyelids: Secondary | ICD-10-CM | POA: Diagnosis not present

## 2019-12-30 DIAGNOSIS — H524 Presbyopia: Secondary | ICD-10-CM | POA: Diagnosis not present

## 2019-12-30 DIAGNOSIS — H3500 Unspecified background retinopathy: Secondary | ICD-10-CM | POA: Diagnosis not present

## 2020-06-13 DIAGNOSIS — Z0000 Encounter for general adult medical examination without abnormal findings: Secondary | ICD-10-CM | POA: Diagnosis not present

## 2020-06-13 DIAGNOSIS — Z23 Encounter for immunization: Secondary | ICD-10-CM | POA: Diagnosis not present

## 2020-06-15 DIAGNOSIS — Z125 Encounter for screening for malignant neoplasm of prostate: Secondary | ICD-10-CM | POA: Diagnosis not present

## 2020-06-15 DIAGNOSIS — E559 Vitamin D deficiency, unspecified: Secondary | ICD-10-CM | POA: Diagnosis not present

## 2020-06-15 DIAGNOSIS — Z0000 Encounter for general adult medical examination without abnormal findings: Secondary | ICD-10-CM | POA: Diagnosis not present

## 2020-06-15 DIAGNOSIS — Z13220 Encounter for screening for lipoid disorders: Secondary | ICD-10-CM | POA: Diagnosis not present

## 2020-06-21 ENCOUNTER — Ambulatory Visit
Admission: RE | Admit: 2020-06-21 | Discharge: 2020-06-21 | Disposition: A | Discharge status: Home / Self Care | Payer: BC Managed Care – PPO | Source: Ambulatory Visit | Attending: Family Medicine | Admitting: Family Medicine

## 2020-06-21 ENCOUNTER — Other Ambulatory Visit: Payer: Self-pay | Admitting: Family Medicine

## 2020-06-21 ENCOUNTER — Other Ambulatory Visit: Payer: Self-pay

## 2020-06-21 DIAGNOSIS — D869 Sarcoidosis, unspecified: Secondary | ICD-10-CM

## 2020-06-21 DIAGNOSIS — R059 Cough, unspecified: Secondary | ICD-10-CM | POA: Diagnosis not present

## 2020-08-16 DIAGNOSIS — L821 Other seborrheic keratosis: Secondary | ICD-10-CM | POA: Diagnosis not present

## 2020-08-16 DIAGNOSIS — D485 Neoplasm of uncertain behavior of skin: Secondary | ICD-10-CM | POA: Diagnosis not present

## 2020-08-16 DIAGNOSIS — Z85828 Personal history of other malignant neoplasm of skin: Secondary | ICD-10-CM | POA: Diagnosis not present

## 2020-09-13 DIAGNOSIS — Z23 Encounter for immunization: Secondary | ICD-10-CM | POA: Diagnosis not present

## 2020-12-27 DIAGNOSIS — U071 COVID-19: Secondary | ICD-10-CM | POA: Diagnosis not present

## 2021-02-08 DIAGNOSIS — J9801 Acute bronchospasm: Secondary | ICD-10-CM | POA: Diagnosis not present

## 2021-05-08 DIAGNOSIS — H2513 Age-related nuclear cataract, bilateral: Secondary | ICD-10-CM | POA: Diagnosis not present

## 2021-05-08 DIAGNOSIS — H524 Presbyopia: Secondary | ICD-10-CM | POA: Diagnosis not present

## 2021-05-08 DIAGNOSIS — H5203 Hypermetropia, bilateral: Secondary | ICD-10-CM | POA: Diagnosis not present

## 2021-05-30 DIAGNOSIS — K5120 Ulcerative (chronic) proctitis without complications: Secondary | ICD-10-CM | POA: Diagnosis not present

## 2021-05-30 DIAGNOSIS — K625 Hemorrhage of anus and rectum: Secondary | ICD-10-CM | POA: Diagnosis not present

## 2021-05-30 DIAGNOSIS — R1032 Left lower quadrant pain: Secondary | ICD-10-CM | POA: Diagnosis not present

## 2021-05-30 DIAGNOSIS — K5190 Ulcerative colitis, unspecified, without complications: Secondary | ICD-10-CM | POA: Diagnosis not present

## 2021-06-05 DIAGNOSIS — R946 Abnormal results of thyroid function studies: Secondary | ICD-10-CM | POA: Diagnosis not present

## 2021-06-27 DIAGNOSIS — K5120 Ulcerative (chronic) proctitis without complications: Secondary | ICD-10-CM | POA: Diagnosis not present

## 2021-06-27 DIAGNOSIS — E559 Vitamin D deficiency, unspecified: Secondary | ICD-10-CM | POA: Diagnosis not present

## 2021-06-27 DIAGNOSIS — Z0000 Encounter for general adult medical examination without abnormal findings: Secondary | ICD-10-CM | POA: Diagnosis not present

## 2021-06-27 DIAGNOSIS — Z13220 Encounter for screening for lipoid disorders: Secondary | ICD-10-CM | POA: Diagnosis not present

## 2021-06-27 DIAGNOSIS — K5730 Diverticulosis of large intestine without perforation or abscess without bleeding: Secondary | ICD-10-CM | POA: Diagnosis not present

## 2021-06-27 DIAGNOSIS — K5190 Ulcerative colitis, unspecified, without complications: Secondary | ICD-10-CM | POA: Diagnosis not present

## 2021-06-27 DIAGNOSIS — Z86010 Personal history of colonic polyps: Secondary | ICD-10-CM | POA: Diagnosis not present

## 2021-06-27 DIAGNOSIS — Z125 Encounter for screening for malignant neoplasm of prostate: Secondary | ICD-10-CM | POA: Diagnosis not present

## 2021-07-02 ENCOUNTER — Other Ambulatory Visit: Payer: Self-pay | Admitting: Family Medicine

## 2021-07-02 DIAGNOSIS — E291 Testicular hypofunction: Secondary | ICD-10-CM | POA: Diagnosis not present

## 2021-07-02 DIAGNOSIS — R609 Edema, unspecified: Secondary | ICD-10-CM | POA: Diagnosis not present

## 2021-07-02 DIAGNOSIS — Z0000 Encounter for general adult medical examination without abnormal findings: Secondary | ICD-10-CM

## 2021-07-02 DIAGNOSIS — R972 Elevated prostate specific antigen [PSA]: Secondary | ICD-10-CM | POA: Diagnosis not present

## 2021-08-28 ENCOUNTER — Ambulatory Visit
Admission: RE | Admit: 2021-08-28 | Discharge: 2021-08-28 | Disposition: A | Discharge status: Home / Self Care | Payer: No Typology Code available for payment source | Source: Ambulatory Visit | Attending: Family Medicine | Admitting: Family Medicine

## 2021-08-28 DIAGNOSIS — Z0000 Encounter for general adult medical examination without abnormal findings: Secondary | ICD-10-CM

## 2021-08-28 DIAGNOSIS — Z136 Encounter for screening for cardiovascular disorders: Secondary | ICD-10-CM | POA: Diagnosis not present

## 2021-10-13 ENCOUNTER — Emergency Department (HOSPITAL_COMMUNITY)
Admission: EM | Admit: 2021-10-13 | Discharge: 2021-10-13 | Disposition: A | Discharge status: Home / Self Care | Payer: BC Managed Care – PPO | Attending: Emergency Medicine | Admitting: Emergency Medicine

## 2021-10-13 ENCOUNTER — Other Ambulatory Visit: Payer: Self-pay

## 2021-10-13 ENCOUNTER — Emergency Department (HOSPITAL_COMMUNITY): Payer: BC Managed Care – PPO

## 2021-10-13 ENCOUNTER — Encounter (HOSPITAL_COMMUNITY): Payer: Self-pay

## 2021-10-13 DIAGNOSIS — R079 Chest pain, unspecified: Secondary | ICD-10-CM | POA: Diagnosis not present

## 2021-10-13 DIAGNOSIS — R0789 Other chest pain: Secondary | ICD-10-CM | POA: Diagnosis not present

## 2021-10-13 LAB — CBC
HCT: 45.6 % (ref 39.0–52.0)
Hemoglobin: 15.4 g/dL (ref 13.0–17.0)
MCH: 32.9 pg (ref 26.0–34.0)
MCHC: 33.8 g/dL (ref 30.0–36.0)
MCV: 97.4 fL (ref 80.0–100.0)
Platelets: 183 K/uL (ref 150–400)
RBC: 4.68 MIL/uL (ref 4.22–5.81)
RDW: 12.9 % (ref 11.5–15.5)
WBC: 6.1 K/uL (ref 4.0–10.5)
nRBC: 0 % (ref 0.0–0.2)

## 2021-10-13 LAB — BASIC METABOLIC PANEL WITH GFR
Anion gap: 10 (ref 5–15)
BUN: 13 mg/dL (ref 8–23)
CO2: 23 mmol/L (ref 22–32)
Calcium: 8.9 mg/dL (ref 8.9–10.3)
Chloride: 107 mmol/L (ref 98–111)
Creatinine, Ser: 1.19 mg/dL (ref 0.61–1.24)
GFR, Estimated: >60 mL/min
Glucose, Bld: 88 mg/dL (ref 70–99)
Potassium: 3.9 mmol/L (ref 3.5–5.1)
Sodium: 140 mmol/L (ref 135–145)

## 2021-10-13 LAB — TROPONIN I (HIGH SENSITIVITY)
Troponin I (High Sensitivity): 5 ng/L
Troponin I (High Sensitivity): 4 ng/L

## 2021-10-13 NOTE — Discharge Instructions (Signed)
Your 2 troponins were negative.  This makes it unlikely this is an acute heart attack.  He does not make it possible.  If your symptoms worsen especially upon exertion and then return to the emergency department.  Otherwise please follow-up with your family doctor and cardiologist.

## 2021-10-13 NOTE — ED Triage Notes (Addendum)
Patient had episode of chest pain. Left arm pain and near syncope with nausea while watching tv this am. Reports that the nausea is now intermittent and the cp intermittent as well. Ambulatory and alert and oriented. Just also had cardiac CT with increased calcium and sarcodosis

## 2021-10-13 NOTE — ED Provider Notes (Signed)
[facility] EMERGENCY DEPARTMENT Provider Note   CSN: [id number] Arrival date & time: 10/13/21  1013     History  Chief Complaint  Patient presents with   Chest Pain    Gregory James is a 61 y.o. male.  61 yo M with a cc of chest pain.  Left sided, pin point.  Started this morning.  Watching youtube, felt like he was going to pass out.  Pain coming and going.  Nothing seems to make it worse, nothing makes it better.  Recent Ct as outpatient.  High calcium score, high LDL.  Cards follow up this week.   Father with MI in his 15's.  Hx of HLD.  Denies hx of smoking, HTN, DM.   Denies hx of PE or DVT, homoptysis, lower extremity edema, recent surgery or immobilization.  Does take hormone therapy to increase his testosterone.         Home Medications Prior to Admission medications   Medication Sig Start Date End Date Taking? Authorizing Provider  acetaminophen (TYLENOL) 500 MG tablet Take 1,000 mg by mouth every 6 (six) hours as needed for mild pain.   Yes [provider]  albuterol (VENTOLIN HFA) 108 (90 Base) MCG/ACT inhaler Inhale 1 puff into the lungs every 6 (six) hours as needed for wheezing or shortness of breath.   Yes [provider]  alum hydroxide-mag trisilicate (GAVISCON) 36-64 MG CHEW chewable tablet Chew 1 tablet by mouth daily as needed for indigestion or heartburn.   Yes [provider]  cetirizine (ZYRTEC) 10 MG tablet Take 10 mg by mouth daily.   Yes [provider]  Cholecalciferol (VITAMIN D3 MAXIMUM STRENGTH) 125 MCG (5000 UT) capsule Take 5,000 Units by mouth daily.   Yes [provider]  clomiPHENE (CLOMID) 50 MG tablet Take 25 mg by mouth daily.   Yes [provider]  mesalamine (CANASA) 1000 MG suppository Place 1,000 mg rectally every other day. 05/30/21  Yes [provider]  mesalamine (LIALDA) 1.2 g EC tablet Take 2.4 g by mouth daily. 06/27/21  Yes [provider]       Allergies    Etodolac, Lortab [hydrocodone-acetaminophen], Morphine, Sulfa antibiotics, and Penicillins    Review of Systems   Review of Systems  Physical Exam Updated Vital Signs BP 136/84   Pulse 66   Temp 97.9 F (36.6 C) (Oral)   Resp 14   SpO2 98%  Physical Exam Vitals and nursing note reviewed.  Constitutional:      Appearance: He is well-developed.  HENT:     Head: Normocephalic and atraumatic.  Eyes:     Pupils: Pupils are equal, round, and reactive to light.  Neck:     Vascular: No JVD.  Cardiovascular:     Rate and Rhythm: Normal rate and regular rhythm.     Heart sounds: No murmur heard.    No friction rub. No gallop.  Pulmonary:     Effort: No respiratory distress.     Breath sounds: No wheezing.  Chest:       Comments: Points to area of chest, he is able to reproduce it with palpation.  Abdominal:     General: There is no distension.     Tenderness: There is no abdominal tenderness. There is no guarding or rebound.  Musculoskeletal:        General: Normal range of motion.     Cervical back: Normal range of motion and neck supple.  Skin:  Coloration: Skin is not pale.     Findings: No rash.  Neurological:     Mental Status: He is alert and oriented to person, place, and time.  Psychiatric:        Behavior: Behavior normal.     ED Results / Procedures / Treatments   Labs (all labs ordered are listed, but only abnormal results are displayed) Labs Reviewed  BASIC METABOLIC PANEL  CBC  TROPONIN I (HIGH SENSITIVITY)  TROPONIN I (HIGH SENSITIVITY)    EKG EKG Interpretation  Date/Time:  Sunday October 13 2021 10:28:58 EDT Ventricular Rate:  73 PR Interval:  178 QRS Duration: 86 QT Interval:  404 QTC Calculation: 445 R Axis:   33 Text Interpretation: Normal sinus rhythm Normal ECG No significant change since last tracing Confirmed by Melene Plan [phone]) on 10/13/2021 12:47:41 PM  Radiology DG Chest 2 View  Result Date:  10/13/2021 CLINICAL DATA:  Chest pain EXAM: CHEST - 2 VIEW COMPARISON:  06/21/2020 FINDINGS: Normal heart size and mediastinal contours. No acute infiltrate or edema. No effusion or pneumothorax. No acute osseous findings. IMPRESSION: Negative chest. Electronically Signed   By: Tiburcio Pea M.D.   On: 10/13/2021 11:08    Procedures Procedures    Medications Ordered in ED Medications - No data to display  ED Course/ Medical Decision Making/ A&P                           Medical Decision Making Amount and/or Complexity of Data Reviewed Labs: ordered. Radiology: ordered.   61 yo M with a cc of chest pain.  This is going for about 24 hours.  Started last night.  Left-sided and pinpoint.  Partially reproduced on exam.  Chest x-ray independently interpreted by me without focal infiltrate or pneumothorax.  2 troponins are negative.  No anemia no significant electrolyte abnormality.  I reviewed the patient's medical record.  He had recently had a CT coronary study that showed a high calcium score in his LAD.  I feel his pain is very atypical and seems unlikely to be an acute coronary event.  We will have him follow-up as scheduled with cardiology this week.  3:31 PM:  I have discussed the diagnosis/risks/treatment options with the patient.  Evaluation and diagnostic testing in the emergency department does not suggest an emergent condition requiring admission or immediate intervention beyond what has been performed at this time.  They will follow up with  PCP. We also discussed returning to the ED immediately if new or worsening sx occur. We discussed the sx which are most concerning (e.g., sudden worsening pain, fever, inability to tolerate by mouth) that necessitate immediate return. Medications administered to the patient during their visit and any new prescriptions provided to the patient are listed below.  Medications given during this visit Medications - No data to display   The patient  appears reasonably screen and/or stabilized for discharge and I doubt any other medical condition or other [facility] requiring further screening, evaluation, or treatment in the ED at this time prior to discharge.          Final Clinical Impression(s) / ED Diagnoses Final diagnoses:  Nonspecific chest pain    Rx / DC Orders ED Discharge Orders     None         Melene Plan, DO 10/13/21 1531

## 2021-10-18 ENCOUNTER — Ambulatory Visit: Payer: BC Managed Care – PPO | Attending: Cardiovascular Disease | Admitting: Cardiovascular Disease

## 2021-10-18 ENCOUNTER — Encounter: Payer: Self-pay | Admitting: Cardiovascular Disease

## 2021-10-18 VITALS — BP 132/86 | HR 80 | Ht 74.0 in | Wt 278.2 lb

## 2021-10-18 DIAGNOSIS — M25472 Effusion, left ankle: Secondary | ICD-10-CM

## 2021-10-18 DIAGNOSIS — R072 Precordial pain: Secondary | ICD-10-CM

## 2021-10-18 DIAGNOSIS — E785 Hyperlipidemia, unspecified: Secondary | ICD-10-CM | POA: Diagnosis not present

## 2021-10-18 DIAGNOSIS — M25471 Effusion, right ankle: Secondary | ICD-10-CM

## 2021-10-18 DIAGNOSIS — R002 Palpitations: Secondary | ICD-10-CM

## 2021-10-18 DIAGNOSIS — D869 Sarcoidosis, unspecified: Secondary | ICD-10-CM

## 2021-10-18 DIAGNOSIS — E349 Endocrine disorder, unspecified: Secondary | ICD-10-CM

## 2021-10-18 DIAGNOSIS — I2510 Atherosclerotic heart disease of native coronary artery without angina pectoris: Secondary | ICD-10-CM | POA: Diagnosis not present

## 2021-10-18 DIAGNOSIS — K5190 Ulcerative colitis, unspecified, without complications: Secondary | ICD-10-CM

## 2021-10-18 MED ORDER — METOPROLOL TARTRATE 100 MG PO TABS: ORAL_TABLET | ORAL | 0 refills | Status: DC

## 2021-10-18 MED ORDER — ROSUVASTATIN CALCIUM 10 MG PO TABS: 10.0000 mg | ORAL_TABLET | Freq: Every day | ORAL | 3 refills | Status: DC

## 2021-10-18 NOTE — Patient Instructions (Signed)
Medication Instructions:  START Rosuvastatin 10 mg once daily  *If you need a refill on your cardiac medications before your next appointment, please call your pharmacy*   Lab Work: Your provider would like for you to return in 3 months to have the following labs drawn: fasting Lipid. You do not need an appointment for the lab. Once in our office lobby there is a podium where you can sign in and ring the doorbell to alert [place] that you are here. The lab is open from 8:00 am to 4 pm; closed for lunch from 12:45pm-1:45pm.  You may also go to any of these [facility] locations:   [facility] ([place]) - [place] [address] [employer] [address]   If you have labs (blood work) drawn today and your tests are completely normal, you will receive your results only by: [employer] (if you have MyChart) OR A paper copy in the mail If you have any lab test that is abnormal or we need to change your treatment, we will call you to review the results.  Follow-Up: At [facility], you and your health needs are our priority.  As part of our continuing mission to provide you with exceptional heart care, we have created designated Provider Care Teams.  These Care Teams include your primary Cardiologist (physician) and Advanced Practice Providers (APPs -  Physician Assistants and Nurse Practitioners) who all work together to provide you with the care you need, when you need it.  We recommend signing up for the patient portal called "MyChart".  Sign up information is provided on this After Visit Summary.  MyChart is used to connect with patients for Virtual Visits (Telemedicine).  Patients are able to view lab/test results, encounter notes, upcoming appointments, etc.  Non-urgent messages can be sent to your provider as well.   To learn more about what you can do with MyChart, go to [email or web address].    Your next appointment:   12  month(s)  The format for your next appointment:   In Person  Provider:   Sanda Klein, MD     Other Instructions   Your cardiac CT will be scheduled at one of the below locations:   [facility] [address] [place], [place] [address] [phone]  [facility] [address] [place], [place] [address] [phone]  [facility] [place], [place] [address] [phone]  If scheduled at [facility], please arrive at the [facility] and Children's Entrance (Entrance C2) of [facility] 30 minutes prior to test start time. You can use the FREE valet parking offered at entrance C (encouraged to control the heart rate for the test)  Proceed to the [facility] Radiology Department (first floor) to check-in and test prep.  All radiology patients and guests should use entrance C2 at [facility], accessed from [facility], even though the hospital's physical address listed is [address]    If scheduled at [facility] or [facility], please arrive 15 mins early for check-in and test prep.   Please follow these instructions carefully (unless otherwise directed):  Hold all erectile dysfunction medications at least 3 days (72 hrs) prior to test. (Ie viagra, cialis, sildenafil, tadalafil, etc) We will administer nitroglycerin during this exam.   On the Night  Before the Test: Be sure to Drink plenty of water. Do not consume any caffeinated/decaffeinated beverages or chocolate 12 hours prior to your test. Do not take any antihistamines 12 hours prior to your test.   On the Day of the Test: Drink plenty of water until 1 hour prior to the test. Do not eat any food 1 hour prior to test. You may take your regular medications prior to the test.  Take metoprolol (Lopressor) two hours  prior to test.      After the Test: Drink plenty of water. After receiving IV contrast, you may experience a mild flushed feeling. This is normal. On occasion, you may experience a mild rash up to 24 hours after the test. This is not dangerous. If this occurs, you can take Benadryl 25 mg and increase your fluid intake. If you experience trouble breathing, this can be serious. If it is severe call 911 IMMEDIATELY. If it is mild, please call our office. If you take any of these medications: Glipizide/Metformin, Avandament, Glucavance, please do not take 48 hours after completing test unless otherwise instructed.  We will call to schedule your test 2-4 weeks out understanding that some insurance companies will need an authorization prior to the service being performed.   For non-scheduling related questions, please contact the cardiac imaging nurse navigator should you have any questions/concerns: Marchia Bond, Cardiac Imaging Nurse Navigator Gordy Clement, Cardiac Imaging Nurse Navigator [facility] Direct Office Dial: [phone]   For scheduling needs, including cancellations and rescheduling, please call [place], [phone].   [employer]: Website: [email or web address]  DR. Sallyanne Kuster RECOMMENDS YOU PURCHASE  " [employer]" [employer] FROM THE  [employer]  APP PLAY STORE.  THE APP IS FREE , BUT THE  EQUIPMENT HAS A COST. IT ALLOWS YOU TO OBTAIN A RECORDING OF YOUR HEART RATE AND RHYTHM BY PROVIDING A SHORT STRIP THAT YOU CAN SHARE WITH YOUR PROVIDER.     [facility] - sending an EKG Download app and set up profile. Run EKG - by placing 1-2 fingers on the silver plates After EKG is complete - Download PDF  - Skip password (if you apply a password the provider will need it to view the EKG) Click share button (square with upward arrow) in bottom left corner To send: choose MyChart (first time log into MyChart)  Pop up window  about sending ECG Click continue Choose type of message Choose provider Type subject and message Click send (EKG should be attached)  - To send additional EKGs in one message click the paperclip image and bottom of page to attach.

## 2021-10-19 ENCOUNTER — Encounter: Payer: Self-pay | Admitting: Cardiovascular Disease

## 2021-10-19 NOTE — Progress Notes (Signed)
Cardiology Office Note:    Date:  10/19/2021   ID:  Gregory James, DOB 1960/03/06, MRN [id number]  PCP:  Lawerance Cruel, [facility] Providers Cardiologist:  Sanda Klein, MD     Referring MD: Lawerance Cruel, MD   Chief Complaint  Patient presents with   Palpitations  Gregory James is a 61 y.o. male who is being seen today for the evaluation of Elevated coronary calcium score at the request of Lawerance Cruel, MD.   History of Present Illness:    Gregory James is a 61 y.o. male with a hx of hypercholesterolemia, ulcerative colitis, sarcoidosis (primarily mediastinal lymphadenopathy), remote history of a single episode of paroxysmal atrial fibrillation, hypotestosteronemia, GERD, is referred in consultation for an abnormal coronary calcium score (score 517, 86th percentile).  He does not have a history of clinically relevant CAD or PAD, hypertension, diabetes mellitus or smoking.  He is accompanied by his wife Gregory James, who is a Marine scientist.  He was evaluated in the emergency room on October 1 for chest discomfort accompanied with near syncope.  By the time he went to the emergency room the symptoms have been going on for about 24 hours.  Cardiac enzymes were normal and ECG did not show any ischemic changes.  The discomfort was located in the upper part of his left chest and a very circumscribed area.  ER evaluation suggested that the pain might be musculoskeletal.  He was in normal sinus rhythm throughout that evaluation.  He is fairly active.  Every other day he walks on his home treadmill at an incline of "2" and speed "3" for 45 minutes every other day.  He is not limited by dyspnea or angina.  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  He denies intermittent claudication.  He has not had any focal neurological events to suggest stroke or TIA.  He does not have orthopnea, PND .  Chronically, he has a mild  amount of bilateral ankle edema.  This has always been pitting, may be a little worse in the last few months.  Used to go away by the morning, sometimes now is present when he wakes up.  More than 20 years ago he had an episode of atrial fibrillation that was reportedly due to treatment with excessive amounts of over-the-counter Sudafed for nasal congestion and resolved with discontinuation of the medication.  He reports that he underwent work-up at that time with some type of mild valve problem being identified (none of those results are available to [place] at this time).  Since then he has had palpitations off and on with varying degrees of frequency.  This seems to be more frequent during periods of increased emotional status.  He describes isolated skipped beats rather than a sustained arrhythmia.  These have been more frequent in the last 3 days.  He takes clomiphene for hypotestosteronemia.  He is on salicylates for ulcerative colitis.  His father had a myocardial infarction in his early 64s.  Past Medical History:  Diagnosis Date   Renal disorder     Past Surgical History:  Procedure Laterality Date   BACK SURGERY      Current Medications: Current Meds  Medication Sig   acetaminophen (TYLENOL) 500 MG tablet Take 1,000 mg by mouth every 6 (six) hours as needed for mild pain.   albuterol (VENTOLIN HFA) 108 (90 Base) MCG/ACT inhaler Inhale 1 puff into  the lungs every 6 (six) hours as needed for wheezing or shortness of breath.   alum hydroxide-mag trisilicate (GAVISCON) [id number] MG CHEW chewable tablet Chew 1 tablet by mouth daily as needed for indigestion or heartburn.   cetirizine (ZYRTEC) 10 MG tablet Take 10 mg by mouth daily.   Cholecalciferol (VITAMIN D3 MAXIMUM STRENGTH) 125 MCG (5000 UT) capsule Take 5,000 Units by mouth daily.   clomiPHENE (CLOMID) 50 MG tablet Take 25 mg by mouth daily.   mesalamine (CANASA) 1000 MG suppository Place 1,000 mg rectally every other day.    mesalamine (LIALDA) 1.2 g EC tablet Take 2.4 g by mouth daily.   metoprolol tartrate (LOPRESSOR) 100 MG tablet Take one tablet two hours prior to the test   rosuvastatin (CRESTOR) 10 MG tablet Take 1 tablet (10 mg total) by mouth daily.     Allergies:   Etodolac, Lortab [hydrocodone-acetaminophen], Morphine, Sulfa antibiotics, and Penicillins   Social History   Socioeconomic History   Marital status: Single    Spouse name: Not on file   Number of children: Not on file   Years of education: Not on file   Highest education level: Not on file  Occupational History   Not on file  Tobacco Use   Smoking status: Never   Smokeless tobacco: Never  Substance and Sexual Activity   Alcohol use: Yes   Drug use: Never   Sexual activity: Not on file  Other Topics Concern   Not on file  Social History Narrative   Not on file   Social Determinants of Health   Financial Resource Strain: Not on file  Food Insecurity: Not on file  Transportation Needs: Not on file  Physical Activity: Not on file  Stress: Not on file  Social Connections: Not on file     Family History: The patient's father had myocardial infarction at age 44-65  ROS:   Please see the history of present illness.     All other systems reviewed and are negative.  EKGs/Labs/Other Studies Reviewed:    The following studies were reviewed today: Coronary calcium score 08/29/2021 Score 517 (86th percentile), ascending aorta 3.8 cm, scattered patchy bilateral parenchymal lung densities some nodular, largest measuring 6 mm Notes and labs from Dr. Harrington Challenger  EKG:  EKG is ordered today.  The ekg ordered today demonstrates normal sinus rhythm normal tracing  Recent Labs: 10/13/2021: BUN 13; Creatinine, Ser 1.19; Hemoglobin 15.4; Platelets 183; Potassium 3.9; Sodium 140  Recent Lipid Panel No results found for: "CHOL", "TRIG", "HDL", "CHOLHDL", "VLDL", "LDLCALC", "LDLDIRECT" 06/27/2021 Cholesterol 181, HDL 33, LDL 116,  triglycerides 181  Risk Assessment/Calculations:                Physical Exam:    VS:  BP 132/86   Pulse 80   Ht 6\' 2"  (1.88 m)   Wt 278 lb 3.2 oz (126.2 kg)   SpO2 93%   BMI 35.72 kg/m     Wt Readings from Last 3 Encounters:  10/18/21 278 lb 3.2 oz (126.2 kg)  07/21/17 240 lb (108.9 kg)     GEN: Moderately obese.  Well nourished, well developed in no acute distress HEENT: Normal NECK: No JVD; No carotid bruits LYMPHATICS: No lymphadenopathy CARDIAC: RRR, no murmurs, rubs, gallops.  No clicks or murmurs heard even with a Valsalva maneuver. RESPIRATORY:  Clear to auscultation without rales, wheezing or rhonchi  ABDOMEN: Soft, non-tender, non-distended MUSCULOSKELETAL:  No edema; No deformity  SKIN: Warm and dry NEUROLOGIC:  Alert  and oriented x 3 PSYCHIATRIC:  Normal affect   ASSESSMENT:    1. Precordial pain   2. Atherosclerosis of native coronary artery of native heart without angina pectoris   3. Dyslipidemia (high LDL; low HDL)   4. Severe obesity (BMI 35.0-39.9) with comorbidity (HCC)   5. Palpitations   6. Sarcoidosis   7. Ulcerative colitis without complications, unspecified location ([place])   8. Ankle edema, bilateral   9. Hypotestosteronism    PLAN:    In order of problems listed above:  Precordial pain: Agree that this is most likely musculoskeletal by description, but in view of his elevated coronary calcium score and risk factors further evaluation is indicated.  Prefer coronary CT angiography to treadmill stress testing in this asymptomatic patient. CAD: Even if no significant coronary stenoses are identified he has a substantial plaque burden with a score well over 400, 9 percentile for age and gender.  Aggressive lipid-lowering therapy is indicated.  Target LDL less than 70. HLP: start rosuvastatin and recheck a lipid profile in 3 months.  Unfortunately, he also has low HDL cholesterol mildly elevated triglycerides, pattern suggestive of highly  atherogenic lipid profile/insulin resistance (small dense LDL).  Reviewed the fact that in addition to taking the statin medication he also needs to eat a healthy diet (rich in unsaturated fat and lean protein, low in saturated fat and sweets or starchy foods with high glycemic index).  Ideally he would lose weight down to a BMI of about 25 or at least get his waistline to 34 inches or less. Severe obesity: With comorbid conditions of dyslipidemia and coronary atherosclerosis.  BMI over 35. Palpitations: No arrhythmia was identified on ECG, but during exam he had isolated ectopy, probably PACs or PVCs.  Reportedly he has a remote history of atrial fibrillation.  At this point CHA2DS2-VASc score is 1 (CAD) and even if atrial fibrillation is identified I do not think we would be compelled to start anticoagulation.  Strongly recommended purchasing a commercially available rhythm monitor such as a cardia or smart watch so we can see what his symptomatic palpitations are related to.  We will try to reach we have documents regarding his previous work-up for arrhythmia, including the echocardiogram. Sarcoidosis: This appears to be quiescent for the last 10 years or so.  This primarily manifested with mediastinal lymphadenopathy.  Has a few scattered small nodules in the lung fields that are probably healed granuloma.  At this point no reason to suspect cardiac sarcoidosis. Ulcerative colitis: On salicylates.  Appears well compensated at this time Bilateral ankle edema: Mild.  Advised avoiding sodium rich foods.  Not sure if this is related to his medications in any way (clomiphene and salicylates).  No other clinical findings to suggest congestive heart failure.  Consider repeating his echocardiogram. Hypotestosteronism: Treated with clomiphene           Medication Adjustments/Labs and Tests Ordered: Current medicines are reviewed at length with the patient today.  Concerns regarding medicines are outlined  above.  Orders Placed This Encounter  Procedures   CT CORONARY MORPH W/CTA COR W/SCORE W/CA W/CM &/OR WO/CM   Lipid panel   Meds ordered this encounter  Medications   metoprolol tartrate (LOPRESSOR) 100 MG tablet    Sig: Take one tablet two hours prior to the test    Dispense:  1 tablet    Refill:  0   rosuvastatin (CRESTOR) 10 MG tablet    Sig: Take 1 tablet (10 mg total) by  mouth daily.    Dispense:  90 tablet    Refill:  3    Patient Instructions  Medication Instructions:  START Rosuvastatin 10 mg once daily  *If you need a refill on your cardiac medications before your next appointment, please call your pharmacy*   Lab Work: Your provider would like for you to return in 3 months to have the following labs drawn: fasting Lipid. You do not need an appointment for the lab. Once in our office lobby there is a podium where you can sign in and ring the doorbell to alert [place] that you are here. The lab is open from 8:00 am to 4 pm; closed for lunch from 12:45pm-1:45pm.  You may also go to any of these [facility] locations:   [place] - [place] [employer] [employer] [address]   If you have labs (blood work) drawn today and your tests are completely normal, you will receive your results only by: [employer] (if you have MyChart) OR A paper copy in the mail If you have any lab test that is abnormal or we need to change your treatment, we will call you to review the results.  Follow-Up: At [facility], you and your health needs are our priority.  As part of our continuing mission to provide you with exceptional heart care, we have created designated Provider Care Teams.  These Care Teams include your primary Cardiologist (physician) and Advanced Practice Providers (APPs -  Physician Assistants and Nurse Practitioners) who all work together to provide you with the care you need, when you need  it.  We recommend signing up for the patient portal called "MyChart".  Sign up information is provided on this After Visit Summary.  MyChart is used to connect with patients for Virtual Visits (Telemedicine).  Patients are able to view lab/test results, encounter notes, upcoming appointments, etc.  Non-urgent messages can be sent to your provider as well.   To learn more about what you can do with MyChart, go to [email or web address].    Your next appointment:   12 month(s)  The format for your next appointment:   In Person  Provider:   Sanda Klein, MD     Other Instructions   Your cardiac CT will be scheduled at one of the below locations:   [facility] [address] [place], [place] [address] [phone]  [place] [address] [place], [place] [address] [phone]  [facility] [place],  [address] [phone]  If scheduled at [facility], please arrive at the [facility] and Children's Entrance (Entrance C2) of [facility] 30 minutes prior to test start time. You can use the FREE valet parking offered at entrance C (encouraged to control the heart rate for the test)  Proceed to the [facility] Radiology Department (first floor) to check-in and test prep.  All radiology patients and guests should use entrance C2 at [facility], accessed from [facility], even though the hospital's physical address listed is [address]    If scheduled at [facility] or [facility], please arrive 15 mins early for check-in and test prep.   Please follow these instructions carefully (unless otherwise directed):  Hold all erectile dysfunction medications at least 3 days (72 hrs)  prior to test. (Ie viagra, cialis, sildenafil, tadalafil, etc) We will administer  nitroglycerin during this exam.   On the Night Before the Test: Be sure to Drink plenty of water. Do not consume any caffeinated/decaffeinated beverages or chocolate 12 hours prior to your test. Do not take any antihistamines 12 hours prior to your test.   On the Day of the Test: Drink plenty of water until 1 hour prior to the test. Do not eat any food 1 hour prior to test. You may take your regular medications prior to the test.  Take metoprolol (Lopressor) two hours prior to test.      After the Test: Drink plenty of water. After receiving IV contrast, you may experience a mild flushed feeling. This is normal. On occasion, you may experience a mild rash up to 24 hours after the test. This is not dangerous. If this occurs, you can take Benadryl 25 mg and increase your fluid intake. If you experience trouble breathing, this can be serious. If it is severe call 911 IMMEDIATELY. If it is mild, please call our office. If you take any of these medications: Glipizide/Metformin, Avandament, Glucavance, please do not take 48 hours after completing test unless otherwise instructed.  We will call to schedule your test 2-4 weeks out understanding that some insurance companies will need an authorization prior to the service being performed.   For non-scheduling related questions, please contact the cardiac imaging nurse navigator should you have any questions/concerns: Marchia Bond, Cardiac Imaging Nurse Navigator Gordy Clement, Cardiac Imaging Nurse Navigator  Heart and Vascular Services Direct Office Dial: [phone]   For scheduling needs, including cancellations and rescheduling, please call [place], [phone].   [employer]: Website: [email or web address]  DR. Sallyanne Kuster RECOMMENDS YOU PURCHASE  " [employer]" [employer] FROM THE  [employer]  APP PLAY STORE.  THE APP IS FREE , BUT THE  EQUIPMENT HAS A COST. IT ALLOWS YOU TO OBTAIN A RECORDING OF  YOUR HEART RATE AND RHYTHM BY PROVIDING A SHORT STRIP THAT YOU CAN SHARE WITH YOUR PROVIDER.     [facility] - sending an EKG Download app and set up profile. Run EKG - by placing 1-2 fingers on the silver plates After EKG is complete - Download PDF  - Skip password (if you apply a password the provider will need it to view the EKG) Click share button (square with upward arrow) in bottom left corner To send: choose MyChart (first time log into MyChart)  Pop up window about sending ECG Click continue Choose type of message Choose provider Type subject and message Click send (EKG should be attached)  - To send additional EKGs in one message click the paperclip image and bottom of page to attach.         Signed, Sanda Klein, MD  10/19/2021 1:52 PM    [place]

## 2021-10-21 ENCOUNTER — Telehealth: Payer: Self-pay | Admitting: Cardiovascular Disease

## 2021-10-21 DIAGNOSIS — R82998 Other abnormal findings in urine: Secondary | ICD-10-CM | POA: Diagnosis not present

## 2021-10-21 NOTE — Telephone Encounter (Signed)
Spoke to patient's wife she stated husband started taking Crestor 10 mg this past Saturday.He has noticed since he started taking he has foamy urine.No pain or burning.I will send message to Dr.Croitoru for advice.

## 2021-10-21 NOTE — Telephone Encounter (Signed)
  Pt c/o medication issue:  1. Name of Medication: rosuvastatin (CRESTOR) 10 MG tablet  2. How are you currently taking this medication (dosage and times per day)? Take 1 tablet (10 mg total) by mouth daily.  3. Are you having a reaction (difficulty breathing--STAT)? No   4. What is your medication issue? Pt's wife called, she said, pt is having a reaction with the medication, pt's urine is very foamy . They want to know if this is normal reaction or pt needs to stop taking medication

## 2021-10-21 NOTE — Telephone Encounter (Signed)
Strongly suspect that this is not a side effect from the rosuvastatin.  That would be a very peculiar reaction. Please continue the medication and keep [place] posted. Would be glad to check a urinalysis to see if there is excessive protein in the urine, which can be due to that foaminess.

## 2021-10-21 NOTE — Telephone Encounter (Signed)
Spoke to patient's wife.Dr.Croitoru's advice given.She will have husband come to office today for a U/A.

## 2021-10-22 LAB — URINALYSIS
Bilirubin, UA: NEGATIVE
Glucose, UA: NEGATIVE
Ketones, UA: NEGATIVE
Nitrite, UA: NEGATIVE
RBC, UA: NEGATIVE
Specific Gravity, UA: 1.012 (ref 1.005–1.030)
Urobilinogen, Ur: 1 mg/dL (ref 0.2–1.0)
pH, UA: 6 (ref 5.0–7.5)

## 2021-10-22 NOTE — Addendum Note (Signed)
Addended by: Hinton Dyer on: 10/22/2021 03:36 PM   Modules accepted: Orders

## 2021-10-24 ENCOUNTER — Encounter: Payer: Self-pay | Admitting: *Deleted

## 2021-11-13 ENCOUNTER — Telehealth (HOSPITAL_COMMUNITY): Payer: Self-pay | Admitting: *Deleted

## 2021-11-13 NOTE — Telephone Encounter (Signed)
Attempted to call patient regarding upcoming cardiac CT appointment. °Left message on voicemail with name and callback number ° °Gabriella Guile RN Navigator Cardiac Imaging °[facility] °[phone] Office °[phone] Cell ° °

## 2021-11-14 ENCOUNTER — Other Ambulatory Visit: Payer: Self-pay | Admitting: Internal Medicine

## 2021-11-14 ENCOUNTER — Ambulatory Visit (HOSPITAL_COMMUNITY)
Admission: RE | Admit: 2021-11-14 | Discharge: 2021-11-14 | Disposition: A | Discharge status: Home / Self Care | Payer: BC Managed Care – PPO | Source: Ambulatory Visit | Attending: Cardiovascular Disease | Admitting: Cardiovascular Disease

## 2021-11-14 ENCOUNTER — Ambulatory Visit (HOSPITAL_BASED_OUTPATIENT_CLINIC_OR_DEPARTMENT_OTHER)
Admission: RE | Admit: 2021-11-14 | Discharge: 2021-11-14 | Disposition: A | Discharge status: Home / Self Care | Payer: BC Managed Care – PPO | Source: Ambulatory Visit | Attending: Internal Medicine | Admitting: Internal Medicine

## 2021-11-14 DIAGNOSIS — R931 Abnormal findings on diagnostic imaging of heart and coronary circulation: Secondary | ICD-10-CM | POA: Diagnosis not present

## 2021-11-14 DIAGNOSIS — I2510 Atherosclerotic heart disease of native coronary artery without angina pectoris: Secondary | ICD-10-CM

## 2021-11-14 DIAGNOSIS — R072 Precordial pain: Secondary | ICD-10-CM | POA: Diagnosis not present

## 2021-11-14 MED ORDER — IOHEXOL 350 MG/ML SOLN
95.0000 mL | Freq: Once | INTRAVENOUS | Status: AC | PRN
  Administered 2021-11-14: 95 mL via INTRAVENOUS

## 2021-11-14 MED ORDER — NITROGLYCERIN 0.4 MG SL SUBL
0.8000 mg | SUBLINGUAL_TABLET | Freq: Once | SUBLINGUAL | Status: AC
  Administered 2021-11-14: 0.8 mg via SUBLINGUAL

## 2021-11-14 MED ORDER — NITROGLYCERIN 0.4 MG SL SUBL
SUBLINGUAL_TABLET | SUBLINGUAL | Status: AC
  Filled 2021-11-14: qty 2

## 2021-11-19 ENCOUNTER — Other Ambulatory Visit: Payer: Self-pay

## 2021-11-19 DIAGNOSIS — E785 Hyperlipidemia, unspecified: Secondary | ICD-10-CM

## 2021-12-09 DIAGNOSIS — R972 Elevated prostate specific antigen [PSA]: Secondary | ICD-10-CM | POA: Diagnosis not present

## 2021-12-18 DIAGNOSIS — R319 Hematuria, unspecified: Secondary | ICD-10-CM | POA: Diagnosis not present

## 2021-12-18 DIAGNOSIS — Z87442 Personal history of urinary calculi: Secondary | ICD-10-CM | POA: Diagnosis not present

## 2021-12-18 DIAGNOSIS — Z6833 Body mass index (BMI) 33.0-33.9, adult: Secondary | ICD-10-CM | POA: Diagnosis not present

## 2021-12-19 ENCOUNTER — Other Ambulatory Visit: Payer: Self-pay | Admitting: Family Medicine

## 2021-12-19 DIAGNOSIS — Z87442 Personal history of urinary calculi: Secondary | ICD-10-CM

## 2021-12-19 DIAGNOSIS — R319 Hematuria, unspecified: Secondary | ICD-10-CM

## 2021-12-20 ENCOUNTER — Encounter (HOSPITAL_BASED_OUTPATIENT_CLINIC_OR_DEPARTMENT_OTHER): Payer: Self-pay | Admitting: Urology

## 2021-12-20 ENCOUNTER — Emergency Department (HOSPITAL_BASED_OUTPATIENT_CLINIC_OR_DEPARTMENT_OTHER)
Admission: EM | Admit: 2021-12-20 | Discharge: 2021-12-20 | Disposition: A | Discharge status: Home / Self Care | Payer: BC Managed Care – PPO | Attending: Emergency Medicine | Admitting: Emergency Medicine

## 2021-12-20 ENCOUNTER — Emergency Department (HOSPITAL_BASED_OUTPATIENT_CLINIC_OR_DEPARTMENT_OTHER): Payer: BC Managed Care – PPO

## 2021-12-20 DIAGNOSIS — N130 Hydronephrosis with ureteropelvic junction obstruction: Secondary | ICD-10-CM | POA: Diagnosis not present

## 2021-12-20 DIAGNOSIS — N201 Calculus of ureter: Secondary | ICD-10-CM | POA: Insufficient documentation

## 2021-12-20 DIAGNOSIS — N135 Crossing vessel and stricture of ureter without hydronephrosis: Secondary | ICD-10-CM

## 2021-12-20 DIAGNOSIS — D7389 Other diseases of spleen: Secondary | ICD-10-CM | POA: Diagnosis not present

## 2021-12-20 DIAGNOSIS — N3289 Other specified disorders of bladder: Secondary | ICD-10-CM | POA: Diagnosis not present

## 2021-12-20 DIAGNOSIS — N132 Hydronephrosis with renal and ureteral calculous obstruction: Secondary | ICD-10-CM | POA: Diagnosis not present

## 2021-12-20 DIAGNOSIS — K769 Liver disease, unspecified: Secondary | ICD-10-CM | POA: Diagnosis not present

## 2021-12-20 DIAGNOSIS — N23 Unspecified renal colic: Secondary | ICD-10-CM | POA: Diagnosis not present

## 2021-12-20 DIAGNOSIS — N1330 Unspecified hydronephrosis: Secondary | ICD-10-CM

## 2021-12-20 DIAGNOSIS — R109 Unspecified abdominal pain: Secondary | ICD-10-CM | POA: Diagnosis not present

## 2021-12-20 LAB — CBC WITH DIFFERENTIAL/PLATELET
Abs Immature Granulocytes: 0.02 10*3/uL (ref 0.00–0.07)
Basophils Absolute: 0.1 10*3/uL (ref 0.0–0.1)
Basophils Relative: 1 %
Eosinophils Absolute: 0.1 10*3/uL (ref 0.0–0.5)
Eosinophils Relative: 2 %
HCT: 43.1 % (ref 39.0–52.0)
Hemoglobin: 14.7 g/dL (ref 13.0–17.0)
Immature Granulocytes: 0 %
Lymphocytes Relative: 12 %
Lymphs Abs: 1 10*3/uL (ref 0.7–4.0)
MCH: 32.9 pg (ref 26.0–34.0)
MCHC: 34.1 g/dL (ref 30.0–36.0)
MCV: 96.4 fL (ref 80.0–100.0)
Monocytes Absolute: 0.8 10*3/uL (ref 0.1–1.0)
Monocytes Relative: 9 %
Neutro Abs: 6.8 10*3/uL (ref 1.7–7.7)
Neutrophils Relative %: 76 %
Platelets: 175 10*3/uL (ref 150–400)
RBC: 4.47 MIL/uL (ref 4.22–5.81)
RDW: 12.7 % (ref 11.5–15.5)
WBC: 8.8 10*3/uL (ref 4.0–10.5)
nRBC: 0 % (ref 0.0–0.2)

## 2021-12-20 LAB — URINALYSIS, ROUTINE W REFLEX MICROSCOPIC
Bilirubin Urine: NEGATIVE
Glucose, UA: NEGATIVE mg/dL
Ketones, ur: NEGATIVE mg/dL
Nitrite: NEGATIVE
Protein, ur: 30 mg/dL — AB
Specific Gravity, Urine: 1.02 (ref 1.005–1.030)
pH: 6 (ref 5.0–8.0)

## 2021-12-20 LAB — LIPASE, BLOOD: Lipase: 34 U/L (ref 11–51)

## 2021-12-20 LAB — COMPREHENSIVE METABOLIC PANEL
ALT: 18 U/L (ref 0–44)
AST: 22 U/L (ref 15–41)
Albumin: 3.7 g/dL (ref 3.5–5.0)
Alkaline Phosphatase: 52 U/L (ref 38–126)
Anion gap: 6 (ref 5–15)
BUN: 12 mg/dL (ref 8–23)
CO2: 27 mmol/L (ref 22–32)
Calcium: 8.9 mg/dL (ref 8.9–10.3)
Chloride: 105 mmol/L (ref 98–111)
Creatinine, Ser: 1.25 mg/dL — ABNORMAL HIGH (ref 0.61–1.24)
GFR, Estimated: >60 mL/min (ref >60)
Glucose, Bld: 86 mg/dL (ref 70–99)
Potassium: 4.2 mmol/L (ref 3.5–5.1)
Sodium: 138 mmol/L (ref 135–145)
Total Bilirubin: 0.8 mg/dL (ref 0.3–1.2)
Total Protein: 6.2 g/dL — ABNORMAL LOW (ref 6.5–8.1)

## 2021-12-20 LAB — URINALYSIS, MICROSCOPIC (REFLEX)

## 2021-12-20 MED ORDER — ONDANSETRON HCL 4 MG/2ML IJ SOLN
4.0000 mg | Freq: Once | INTRAMUSCULAR | Status: AC
  Administered 2021-12-20: 4 mg via INTRAVENOUS
  Filled 2021-12-20: qty 2

## 2021-12-20 MED ORDER — ONDANSETRON HCL 4 MG PO TABS: 4.0000 mg | ORAL_TABLET | Freq: Three times a day (TID) | ORAL | 0 refills | Status: AC | PRN

## 2021-12-20 MED ORDER — OXYCODONE HCL 5 MG PO TABS: 5.0000 mg | ORAL_TABLET | Freq: Four times a day (QID) | ORAL | 0 refills | Status: DC | PRN

## 2021-12-20 MED ORDER — SODIUM CHLORIDE 0.9 % IV BOLUS
500.0000 mL | Freq: Once | INTRAVENOUS | Status: AC
  Administered 2021-12-20: 500 mL via INTRAVENOUS

## 2021-12-20 MED ORDER — KETOROLAC TROMETHAMINE 30 MG/ML IJ SOLN
30.0000 mg | Freq: Once | INTRAMUSCULAR | Status: AC
  Administered 2021-12-20: 30 mg via INTRAVENOUS
  Filled 2021-12-20: qty 1

## 2021-12-20 NOTE — ED Notes (Signed)
ED Provider at bedside. 

## 2021-12-20 NOTE — Discharge Instructions (Addendum)
Thank you for letting us take care of you today.  As discussed, you have a 67mm kidney stone on the left that is very unlikely to pass. I have prescribed you pain and nausea medication to take at home until you are able to follow-up with urology. Take the pain medication only as instructed and if you need medication for breakthrough pain, you may take Tylenol or ibuprofen over the counter. It is also very important you stay well hydrated. I consulted your regular urology group, [facility], and they want you to call on Monday so you can set up an appointment to be seen sooner than the one you have scheduled on 12/28.  If you develop fever, uncontrolled pain or vomiting, signs of dehydration, inability to urinate, or other concerning symptoms, please go to [facility] ED as they have on-call urologist who goes to the hospital who can evaluate you in person and perform any necessary interventions.

## 2021-12-20 NOTE — ED Notes (Signed)
Patient transported to CT 

## 2021-12-20 NOTE — ED Triage Notes (Signed)
Left side flank pain that started this am at [address] had hematuria noted yesterday  H/o kidney stones

## 2021-12-20 NOTE — ED Provider Notes (Signed)
[facility] EMERGENCY DEPARTMENT Provider Note   CSN: [id number] Arrival date & time: 12/20/21  1417     History  Chief Complaint  Patient presents with   Flank Pain    Gregory James is a 61 y.o. male with past medical history of recurrent kidney stones who presents to the ED after sudden onset of left flank pain around 9 AM this morning.  Patient noticed some mild hematuria approximately 2 days ago and has been intermittent since that time.  Since onset of his pain this morning, he has had associated nausea but has not had any episodes of vomiting, diarrhea, chest pain, shortness of breath, dysuria, abdominal pain in the front as his pain is localized to the left lower flank and the back.  He reports having up to 15 kidney stones previously throughout his life and has been able to pass all of them except for 1 stone approximately 7 years ago that required removal and one other that required lithotripsy.  Followed by local urologist Dr. Annabell Howells at [facility].  He was evaluated by his PCP due to hematuria 2 days ago who ordered an outpatient CT scan and blood work but patient has not received the results of either of these.  As his normal urologist was unable to set up an office visit for him, he was referred to a different urologist, however, the first available appointment was on December 28th. He has not taken any medication for his nausea or pain today. On initial exam, he declines pain and nausea medication stating his pain is well controlled at this time.       Home Medications Prior to Admission medications   Medication Sig Start Date End Date Taking? Authorizing Provider  ondansetron (ZOFRAN) 4 MG tablet Take 1 tablet (4 mg total) by mouth every 8 (eight) hours as needed for up to 7 days for nausea or vomiting. 12/20/21 12/27/21 Yes Pallavi Clifton L, PA-C  oxyCODONE (ROXICODONE) 5 MG immediate release tablet Take 1 tablet (5 mg total) by mouth every 6 (six) hours as  needed for up to 10 doses for severe pain or moderate pain. 12/20/21  Yes Francoise Chojnowski L, PA-C  acetaminophen (TYLENOL) 500 MG tablet Take 1,000 mg by mouth every 6 (six) hours as needed for mild pain.    [provider]  albuterol (VENTOLIN HFA) 108 (90 Base) MCG/ACT inhaler Inhale 1 puff into the lungs every 6 (six) hours as needed for wheezing or shortness of breath.    [provider]  alum hydroxide-mag trisilicate (GAVISCON) 80-20 MG CHEW chewable tablet Chew 1 tablet by mouth daily as needed for indigestion or heartburn.    [provider]  cetirizine (ZYRTEC) 10 MG tablet Take 10 mg by mouth daily.    [provider]  Cholecalciferol (VITAMIN D3 MAXIMUM STRENGTH) 125 MCG (5000 UT) capsule Take 5,000 Units by mouth daily.    [provider]  clomiPHENE (CLOMID) 50 MG tablet Take 25 mg by mouth daily.    [provider]  mesalamine (CANASA) 1000 MG suppository Place 1,000 mg rectally every other day. 05/30/21   [provider]  mesalamine (LIALDA) 1.2 g EC tablet Take 2.4 g by mouth daily. 06/27/21   [provider]  metoprolol tartrate (LOPRESSOR) 100 MG tablet Take one tablet two hours prior to the test 10/18/21   Croitoru, Mihai, MD  rosuvastatin (CRESTOR) 10 MG tablet Take 1 tablet (10 mg total) by mouth daily. 10/18/21 10/13/22  Croitoru, Mihai, MD      Allergies    Etodolac, Lortab [hydrocodone-acetaminophen], Morphine, Sulfa antibiotics, and Penicillins    Review of Systems   Review of Systems  Constitutional:  Positive for appetite change. Negative for activity change, chills and fever.  HENT:  Negative for congestion, ear pain, rhinorrhea, sore throat and trouble swallowing.   Eyes:  Negative for pain and visual disturbance.  Respiratory:  Negative for cough, chest tightness, shortness of breath and wheezing.   Cardiovascular:  Negative for chest pain, palpitations and leg swelling.  Gastrointestinal:  Positive  for abdominal pain and nausea. Negative for blood in stool, constipation, diarrhea and vomiting.  Genitourinary:  Positive for difficulty urinating, flank pain and hematuria. Negative for decreased urine volume and dysuria.  Musculoskeletal:  Negative for arthralgias.  Skin:  Negative for color change and rash.  Allergic/Immunologic: Negative for immunocompromised state.  Neurological:  Negative for dizziness, seizures, syncope, weakness, light-headedness and headaches.  All other systems reviewed and are negative.   Physical Exam Updated Vital Signs BP 127/85   Pulse 66   Temp 97.8 F (36.6 C) (Oral)   Resp 16   Ht 6\' 2"  (1.88 m)   Wt 126.2 kg   SpO2 95%   BMI 35.72 kg/m  Physical Exam Vitals and nursing note reviewed.  Constitutional:      General: He is not in acute distress.    Appearance: Normal appearance. He is not ill-appearing, toxic-appearing or diaphoretic.  HENT:     Head: Normocephalic and atraumatic.     Mouth/Throat:     Mouth: Mucous membranes are moist.  Eyes:     Conjunctiva/sclera: Conjunctivae normal.  Cardiovascular:     Rate and Rhythm: Normal rate and regular rhythm.     Heart sounds: Normal heart sounds. No murmur heard. Pulmonary:     Effort: Pulmonary effort is normal. No respiratory distress.     Breath sounds: Normal breath sounds. No wheezing, rhonchi or rales.  Abdominal:     General: Abdomen is flat. Bowel sounds are normal. There is no distension.     Palpations: Abdomen is soft.     Tenderness: There is no abdominal tenderness. There is left CVA tenderness. There is no right CVA tenderness, guarding or rebound.  Musculoskeletal:        General: Normal range of motion.     Cervical back: Neck supple.     Right lower leg: No edema.     Left lower leg: No edema.  Skin:    General: Skin is warm and dry.     Capillary Refill: Capillary refill takes less than 2 seconds.     Coloration: Skin is not jaundiced or pale.     Findings: No rash.   Neurological:     Mental Status: He is alert. Mental status is at baseline.  Psychiatric:        Mood and Affect: Mood normal.        Behavior: Behavior normal.     ED Results / Procedures / Treatments   Labs (all labs ordered are listed, but only abnormal results are displayed) Labs Reviewed  URINALYSIS, ROUTINE W REFLEX MICROSCOPIC - Abnormal; Notable for the following components:      Result Value   Hgb urine dipstick MODERATE (*)    Protein, ur 30 (*)    Leukocytes,Ua TRACE (*)    All other components within normal limits  URINALYSIS, MICROSCOPIC (REFLEX) - Abnormal; Notable for the following components:  Bacteria, UA FEW (*)    All other components within normal limits  COMPREHENSIVE METABOLIC PANEL - Abnormal; Notable for the following components:   Creatinine, Ser 1.25 (*)    Total Protein 6.2 (*)    All other components within normal limits  URINE CULTURE  CBC WITH DIFFERENTIAL/PLATELET  LIPASE, BLOOD  CBC WITH DIFFERENTIAL/PLATELET    EKG None  Radiology CT Renal Stone Study  Result Date: 12/20/2021 CLINICAL DATA:  Left-sided flank pain. EXAM: CT ABDOMEN AND PELVIS WITHOUT CONTRAST TECHNIQUE: Multidetector CT imaging of the abdomen and pelvis was performed following the standard protocol without IV contrast. RADIATION DOSE REDUCTION: This exam was performed according to the departmental dose-optimization program which includes automated exposure control, adjustment of the mA and/or kV according to patient size and/or use of iterative reconstruction technique. COMPARISON:  CT Urogram 07/27/17 FINDINGS: Lower chest: No acute abnormality. Hepatobiliary: Liver has a normal contour. There is an ill-defined focal hypodense lesion at the superior aspect of the right hepatic lobe which was present in 2010, but has progressively increased in size. Gallbladder is normal in appearance. Pancreas: No evidence of peripancreatic fat stranding. No pancreatic ductal dilatation. Spleen:  Normal in size. There is now a focal calcification in a region of hypodense lesion seen in 2010. Adrenals/Urinary Tract: Bilateral adrenal glands are normal in size. There are multiple bilateral renal stones. On the left there is moderate hydronephrosis secondary to an obstructing 10 mm stone at the UPJ. In the right kidney there is a 8 mm renal stone which obstructs a right lower pole major calyx resulting in focal hydronephrosis. Urinary bladder is partially decompressed. There is circumferential bladder wall thickening, which is nonspecific, but further evaluation with urinalysis is recommended to exclude the possibility of infection. Stomach/Bowel: The stomach, small bowel, large bowel are normal in caliber. There is no evidence of focal wall thickening. No bowel obstruction. There is diverticulosis without evidence of diverticulitis. There is moderate colonic stool burden. The appendix is normal in appearance. Vascular/Lymphatic: No significant vascular findings are present. No enlarged abdominal or pelvic lymph nodes. Reproductive: The prostate is normal in appearance. Other: No abdominal wall hernia or abnormality. No abdominopelvic ascites. Musculoskeletal: Postsurgical changes in the lower lumbar spine with a prior facetectomy at L4. there is a sclerotic lesion of the L3 vertebral body measuring 1.5 x 1.2 x 1.5 cm. This lesion measured 0.8 x 0.6 x 0.5 cm in 2010, and 1.3 x 1.2 x 1.2 cm in 2019. IMPRESSION: 1. Moderate left hydronephrosis secondary to an obstructing 10 mm stone at the left UPJ. 2. There is a 8 mm renal stone which obstructs a right lower pole major calyx resulting in focal hydronephrosis. 3. Circumferential bladder wall thickening, which is nonspecific, but further evaluation with urinalysis is recommended to exclude the possibility of infection. 4. There is an ill-defined focal hypodense lesion at the superior aspect of the right hepatic lobe which was present in 2010, but has progressively  increased in size. This is incompletely characterized on this noncontrast exam. Recommend further evaluation with a contrast-enhanced MRI of the abdomen. 5. There is an isolated sclerotic lesion at the L3 vertebral body measuring 1.5 x 1.2 x 1.5 cm. This lesion has progressively increased in size compared to 2010. This is indeterminate, but given the slow growth over time, a benign etiology is favored. Recommend further evaluation with PSA and consider further evaluation with bone scan. Electronically Signed   By: Lorenza Cambridge M.D.   On: 12/20/2021 15:08  Procedures None  Medications Ordered in ED Medications  sodium chloride 0.9 % bolus 500 mL (0 mLs Intravenous Stopped 12/20/21 1850)  ondansetron (ZOFRAN) injection 4 mg (4 mg Intravenous Given 12/20/21 1750)  ketorolac (TORADOL) 30 MG/ML injection 30 mg (30 mg Intravenous Given 12/20/21 1813)    ED Course/ Medical Decision Making/ A&P                           Medical Decision Making Amount and/or Complexity of Data Reviewed Labs: ordered. Decision-making details documented in ED Course. Radiology: ordered. Decision-making details documented in ED Course.  Risk OTC drugs. Prescription drug management.   This is a 61 year old male with a long-standing history of recurrent kidney stones who presents to ED for acute onset of L flank pain this morning and hematuria for the last 2 days with CT renal study showing 10mm objstructing UPJ stone with moderate hydronephrosis. Pt's exam is reassuring with only minimal L CVA tenderness, vital signs are unremarkable, he appears well hydrated and is tolerating PO, his pain is well controlled, and he has no signs of infection including but not limited to leukocytosis, fever, or dysuria. With large obstructing stone, discussed with on-call urologist for [facility] where pt is followed and they recommended outpatient management with pt calling office on Monday to arrange follow-up sooner than his  scheduled appointment on 12/18. Reviewed kidney function, CT scan, UA results, and remainder of work-up with urology who did not recommend antibiotic treatment for possible complication of UTI/infected stone as urine only showed a few bacteria and had contamination with epithelial cells. With persistent nausea, pt treated with IV Zofran and small fluid bolus in ED. Declined pain medication on repeat exams but just prior to discharge did request a dose of pain medication so was given Toradol IV which he tolerated and controlled his pain well. Kidney function approximately at baseline at sCr 1.25 with last 1.19. Patient aware of plan to discharge and follow-up with urology Monday. PDMP reviewed and negative for recent narcotic prescriptions so will send home with small supply of oxycodone for pain control, Zofran for nausea control, and recommend Tylenol and ibuprofen for breakthrough pain. Pt instructed to return to ED for re-evaluation if he develops fever, uncontrolled pain, uncontrolled vomiting, dehydration, inability to urinate, or other concerns. Aware it is best he go to [facility] if this happens as they have on-site urology consult available. Pt and wife agreed and happy with treatment plan. All questions answered. Case reviewed with attending MD who agreed with management and treatment.          Final Clinical Impression(s) / ED Diagnoses Final diagnoses:  Ureteropelvic junction (UPJ) obstruction  Ureterolithiasis  Hydronephrosis of left kidney    Rx / DC Orders ED Discharge Orders          Ordered    ondansetron (ZOFRAN) 4 MG tablet  Every 8 hours PRN        12/20/21 1845    oxyCODONE (ROXICODONE) 5 MG immediate release tablet  Every 6 hours PRN        12/20/21 1845              Tonette LedererGowens, Evadean Sproule L, PA-C 12/20/21 1853    Rexford MausKingsley, Victoria K, DO 12/20/21 1922

## 2021-12-22 LAB — URINE CULTURE: Culture: NO GROWTH

## 2021-12-23 ENCOUNTER — Other Ambulatory Visit: Payer: Self-pay | Admitting: Urology

## 2021-12-23 DIAGNOSIS — N130 Hydronephrosis with ureteropelvic junction obstruction: Secondary | ICD-10-CM | POA: Diagnosis not present

## 2021-12-23 DIAGNOSIS — N200 Calculus of kidney: Secondary | ICD-10-CM | POA: Diagnosis not present

## 2021-12-25 ENCOUNTER — Encounter (HOSPITAL_BASED_OUTPATIENT_CLINIC_OR_DEPARTMENT_OTHER): Payer: Self-pay | Admitting: Urology

## 2021-12-25 NOTE — Progress Notes (Signed)
Preop phone called made. Arrival time 12 noon. Nothing to eat or drink after 8 AM, no milk or milk products. No meds to take, no vitamins or supplements after today. (Doses taken prior to phone call. Wear loose fitting clothes without metal zippers, buttons, or buckles and no flip flops, sandals, crocs, or clogs.

## 2021-12-26 ENCOUNTER — Ambulatory Visit (HOSPITAL_BASED_OUTPATIENT_CLINIC_OR_DEPARTMENT_OTHER)
Admission: RE | Admit: 2021-12-26 | Discharge: 2021-12-26 | Disposition: A | Discharge status: Home / Self Care | Payer: BC Managed Care – PPO | Attending: Urology | Admitting: Urology

## 2021-12-26 ENCOUNTER — Encounter (HOSPITAL_BASED_OUTPATIENT_CLINIC_OR_DEPARTMENT_OTHER)
Admission: RE | Disposition: A | Discharge status: Home / Self Care | Payer: Self-pay | Source: Home / Self Care | Attending: Urology

## 2021-12-26 ENCOUNTER — Encounter (HOSPITAL_BASED_OUTPATIENT_CLINIC_OR_DEPARTMENT_OTHER): Payer: Self-pay | Admitting: Urology

## 2021-12-26 ENCOUNTER — Ambulatory Visit (HOSPITAL_COMMUNITY): Payer: BC Managed Care – PPO

## 2021-12-26 ENCOUNTER — Other Ambulatory Visit: Payer: Self-pay

## 2021-12-26 DIAGNOSIS — N132 Hydronephrosis with renal and ureteral calculous obstruction: Secondary | ICD-10-CM | POA: Insufficient documentation

## 2021-12-26 DIAGNOSIS — R112 Nausea with vomiting, unspecified: Secondary | ICD-10-CM | POA: Diagnosis not present

## 2021-12-26 DIAGNOSIS — K769 Liver disease, unspecified: Secondary | ICD-10-CM | POA: Diagnosis not present

## 2021-12-26 DIAGNOSIS — Z9889 Other specified postprocedural states: Secondary | ICD-10-CM | POA: Diagnosis not present

## 2021-12-26 DIAGNOSIS — N201 Calculus of ureter: Secondary | ICD-10-CM | POA: Insufficient documentation

## 2021-12-26 DIAGNOSIS — K5730 Diverticulosis of large intestine without perforation or abscess without bleeding: Secondary | ICD-10-CM | POA: Diagnosis not present

## 2021-12-26 DIAGNOSIS — N210 Calculus in bladder: Secondary | ICD-10-CM | POA: Diagnosis not present

## 2021-12-26 DIAGNOSIS — N200 Calculus of kidney: Secondary | ICD-10-CM | POA: Diagnosis not present

## 2021-12-26 HISTORY — PX: EXTRACORPOREAL SHOCK WAVE LITHOTRIPSY: SHX1557

## 2021-12-26 SURGERY — LITHOTRIPSY, ESWL
Anesthesia: LOCAL | Laterality: Left

## 2021-12-26 MED ORDER — DIPHENHYDRAMINE HCL 25 MG PO CAPS
ORAL_CAPSULE | ORAL | Status: AC
  Filled 2021-12-26: qty 1

## 2021-12-26 MED ORDER — DIPHENHYDRAMINE HCL 25 MG PO CAPS
25.0000 mg | ORAL_CAPSULE | ORAL | Status: AC
  Administered 2021-12-26: 25 mg via ORAL

## 2021-12-26 MED ORDER — DIAZEPAM 5 MG PO TABS
10.0000 mg | ORAL_TABLET | ORAL | Status: AC
  Administered 2021-12-26: 10 mg via ORAL

## 2021-12-26 MED ORDER — OXYCODONE HCL 5 MG PO TABS: 5.0000 mg | ORAL_TABLET | Freq: Four times a day (QID) | ORAL | 0 refills | Status: AC | PRN

## 2021-12-26 MED ORDER — CIPROFLOXACIN HCL 500 MG PO TABS
500.0000 mg | ORAL_TABLET | ORAL | Status: AC
  Administered 2021-12-26: 500 mg via ORAL

## 2021-12-26 MED ORDER — DIAZEPAM 5 MG PO TABS
ORAL_TABLET | ORAL | Status: AC
  Filled 2021-12-26: qty 2

## 2021-12-26 MED ORDER — SODIUM CHLORIDE 0.9 % IV SOLN: INTRAVENOUS | Status: DC

## 2021-12-26 MED ORDER — CIPROFLOXACIN HCL 500 MG PO TABS
ORAL_TABLET | ORAL | Status: AC
  Filled 2021-12-26: qty 1

## 2021-12-26 NOTE — Op Note (Signed)
See [employer] operative note scanned into chart. Also because of the size, density, location and other factors that cannot be anticipated I feel this will likely be a staged procedure. This fact supersedes any indication in the scanned [place] stone operative note to the contrary.  Irine Seal MD 12/26/2021, 3:04 PM  [facility]  Pager: [phone]

## 2021-12-26 NOTE — H&P (Signed)
H&P  History of Present Illness: Gregory James is a 61 y.o. year old M who presents today for L ESWL for a proximal left stone  Past Medical History:  Diagnosis Date   Renal disorder     Past Surgical History:  Procedure Laterality Date   BACK SURGERY      Home Medications:  Current Meds  Medication Sig   cetirizine (ZYRTEC) 10 MG tablet Take 10 mg by mouth daily.   Cholecalciferol (VITAMIN D3 MAXIMUM STRENGTH) 125 MCG (5000 UT) capsule Take 5,000 Units by mouth daily.   clomiPHENE (CLOMID) 50 MG tablet Take 25 mg by mouth daily.   rosuvastatin (CRESTOR) 10 MG tablet Take 1 tablet (10 mg total) by mouth daily.    Allergies:  Allergies  Allergen Reactions   Etodolac Nausea Only and Other (See Comments)    Caused roaring in head   Lortab [Hydrocodone-Acetaminophen] Nausea Only   Morphine Nausea Only and Other (See Comments)   Sulfa Antibiotics Hives   Penicillins Rash    No family history on file.  Social History:  reports that he has never smoked. He has never used smokeless tobacco. He reports current alcohol use. He reports that he does not use drugs.  ROS: A complete review of systems was performed.  All systems are negative except for pertinent findings as noted.  Physical Exam:  Vital signs in last 24 hours:   Constitutional:  Alert and oriented, No acute distress Cardiovascular: Regular rate and rhythm Respiratory: Normal respiratory effort, Lungs clear bilaterally GI: Abdomen is soft, nontender, nondistended, no abdominal masses Lymphatic: No lymphadenopathy Neurologic: Grossly intact, no focal deficits Psychiatric: Normal mood and affect   Laboratory Data:  No results for input(s): "WBC", "HGB", "HCT", "PLT" in the last 72 hours.  No results for input(s): "NA", "K", "CL", "GLUCOSE", "BUN", "CALCIUM", "CREATININE" in the last 72 hours.  Invalid input(s): "CO3"   No results found for this or any previous visit (from the past 24 hour(s)). Recent  Results (from the past 240 hour(s))  Urine Culture     Status: None   Collection Time: 12/20/21  3:38 PM   Specimen: Urine, Clean Catch  Result Value Ref Range Status   Specimen Description   Final    URINE, CLEAN CATCH Performed at [facility], [address] [place], [place] [address]    Special Requests   Final    NONE Performed at [facility], [address] [place], [place] [address]    Culture   Final    NO GROWTH Performed at [facility], [address] [place], [place] [address]    Report Status 12/22/2021 FINAL  Final    Renal Function: Recent Labs    12/20/21 1645  CREATININE 1.25*   Estimated Creatinine Clearance: 85.4 mL/min (A) (by C-G formula based on SCr of 1.25 mg/dL (H)).  Radiologic Imaging: No results found.  Assessment:  Gregory James is a 61 y.o. year old with a left UPJ stone  Plan:  To OR for ESWL as planned. Risks reviewed  Irine Seal, MD 12/26/2021, 12:32 PM  [facility] Pager: [phone]

## 2021-12-26 NOTE — Discharge Instructions (Addendum)
1. You should strain your urine and collect all fragments and bring them to your follow up appointment.  2. You should take your pain medication as needed.  Please call if your pain is severe to the point that it is not controlled with your pain medication. 3. You should call if you develop fever > 101 or persistent nausea or vomiting. 4. Your doctor may prescribe tamsulosin to take to help facilitate stone passage.    Post Anesthesia Home Care Instructions  Activity: Get plenty of rest for the remainder of the day. A responsible individual must stay with you for 24 hours following the procedure.  For the next 24 hours, DO NOT: -Drive a car -Operate machinery -Drink alcoholic beverages -Take any medication unless instructed by your physician -Make any legal decisions or sign important papers.  Meals: Start with liquid foods such as gelatin or soup. Progress to regular foods as tolerated. Avoid greasy, spicy, heavy foods. If nausea and/or vomiting occur, drink only clear liquids until the nausea and/or vomiting subsides. Call your physician if vomiting continues.  Special Instructions/Symptoms: Your throat may feel dry or sore from the anesthesia or the breathing tube placed in your throat during surgery. If this causes discomfort, gargle with warm salt water. The discomfort should disappear within 24 hours.  

## 2021-12-27 ENCOUNTER — Encounter (HOSPITAL_COMMUNITY): Payer: Self-pay

## 2021-12-27 ENCOUNTER — Emergency Department (HOSPITAL_COMMUNITY)
Admission: EM | Admit: 2021-12-27 | Discharge: 2021-12-27 | Disposition: A | Discharge status: Home / Self Care | Payer: BC Managed Care – PPO | Source: Home / Self Care | Attending: Emergency Medicine | Admitting: Emergency Medicine

## 2021-12-27 ENCOUNTER — Emergency Department (HOSPITAL_COMMUNITY): Payer: BC Managed Care – PPO

## 2021-12-27 ENCOUNTER — Other Ambulatory Visit: Payer: Self-pay

## 2021-12-27 DIAGNOSIS — K5730 Diverticulosis of large intestine without perforation or abscess without bleeding: Secondary | ICD-10-CM | POA: Diagnosis not present

## 2021-12-27 DIAGNOSIS — K769 Liver disease, unspecified: Secondary | ICD-10-CM | POA: Diagnosis not present

## 2021-12-27 DIAGNOSIS — R110 Nausea: Secondary | ICD-10-CM | POA: Diagnosis not present

## 2021-12-27 DIAGNOSIS — N201 Calculus of ureter: Secondary | ICD-10-CM

## 2021-12-27 DIAGNOSIS — N210 Calculus in bladder: Secondary | ICD-10-CM | POA: Diagnosis not present

## 2021-12-27 DIAGNOSIS — N132 Hydronephrosis with renal and ureteral calculous obstruction: Secondary | ICD-10-CM | POA: Diagnosis not present

## 2021-12-27 LAB — URINALYSIS, ROUTINE W REFLEX MICROSCOPIC
Bilirubin Urine: NEGATIVE
Glucose, UA: NEGATIVE mg/dL
Ketones, ur: 20 mg/dL — AB
Nitrite: NEGATIVE
Protein, ur: 30 mg/dL — AB
RBC / HPF: >50 RBC/hpf — ABNORMAL HIGH (ref 0–5)
Specific Gravity, Urine: 1.021 (ref 1.005–1.030)
pH: 7 (ref 5.0–8.0)

## 2021-12-27 LAB — COMPREHENSIVE METABOLIC PANEL
ALT: 18 U/L (ref 0–44)
AST: 20 U/L (ref 15–41)
Albumin: 4.1 g/dL (ref 3.5–5.0)
Alkaline Phosphatase: 51 U/L (ref 38–126)
Anion gap: 9 (ref 5–15)
BUN: 18 mg/dL (ref 8–23)
CO2: 22 mmol/L (ref 22–32)
Calcium: 9.2 mg/dL (ref 8.9–10.3)
Chloride: 106 mmol/L (ref 98–111)
Creatinine, Ser: 1.26 mg/dL — ABNORMAL HIGH (ref 0.61–1.24)
GFR, Estimated: >60 mL/min (ref >60)
Glucose, Bld: 109 mg/dL — ABNORMAL HIGH (ref 70–99)
Potassium: 3.8 mmol/L (ref 3.5–5.1)
Sodium: 137 mmol/L (ref 135–145)
Total Bilirubin: 0.9 mg/dL (ref 0.3–1.2)
Total Protein: 7.1 g/dL (ref 6.5–8.1)

## 2021-12-27 LAB — CBC
HCT: 44.4 % (ref 39.0–52.0)
Hemoglobin: 14.9 g/dL (ref 13.0–17.0)
MCH: 32.7 pg (ref 26.0–34.0)
MCHC: 33.6 g/dL (ref 30.0–36.0)
MCV: 97.4 fL (ref 80.0–100.0)
Platelets: 185 10*3/uL (ref 150–400)
RBC: 4.56 MIL/uL (ref 4.22–5.81)
RDW: 12.8 % (ref 11.5–15.5)
WBC: 9.6 10*3/uL (ref 4.0–10.5)
nRBC: 0 % (ref 0.0–0.2)

## 2021-12-27 MED ORDER — ONDANSETRON 4 MG PO TBDP: 4.0000 mg | ORAL_TABLET | Freq: Three times a day (TID) | ORAL | 0 refills | Status: DC | PRN

## 2021-12-27 MED ORDER — ONDANSETRON HCL 4 MG/2ML IJ SOLN
4.0000 mg | Freq: Once | INTRAMUSCULAR | Status: AC
  Administered 2021-12-27: 4 mg via INTRAVENOUS
  Filled 2021-12-27: qty 2

## 2021-12-27 MED ORDER — METOCLOPRAMIDE HCL 5 MG/ML IJ SOLN
10.0000 mg | Freq: Once | INTRAMUSCULAR | Status: AC
  Administered 2021-12-27: 10 mg via INTRAVENOUS
  Filled 2021-12-27: qty 2

## 2021-12-27 MED ORDER — DIPHENHYDRAMINE HCL 25 MG PO CAPS
25.0000 mg | ORAL_CAPSULE | Freq: Once | ORAL | Status: AC
  Administered 2021-12-27: 25 mg via ORAL
  Filled 2021-12-27: qty 1

## 2021-12-27 MED ORDER — SODIUM CHLORIDE 0.9 % IV BOLUS
1000.0000 mL | Freq: Once | INTRAVENOUS | Status: AC
  Administered 2021-12-27: 1000 mL via INTRAVENOUS

## 2021-12-27 MED ORDER — FENTANYL CITRATE PF 50 MCG/ML IJ SOSY
75.0000 ug | PREFILLED_SYRINGE | INTRAMUSCULAR | Status: AC | PRN
  Administered 2021-12-27 (×2): 75 ug via INTRAVENOUS
  Filled 2021-12-27 (×2): qty 2

## 2021-12-27 MED ORDER — OXYCODONE-ACETAMINOPHEN 5-325 MG PO TABS
2.0000 | ORAL_TABLET | Freq: Once | ORAL | Status: AC
  Administered 2021-12-27: 2 via ORAL
  Filled 2021-12-27: qty 2

## 2021-12-27 NOTE — Discharge Instructions (Signed)
I have printed a copy of your CT results. You are hydrating well, take Zofran 4 mg every 8 hours for 3 doses.  Tylenol 650mg  every 6 hours (do not take more tahn 4000mg  in a day -- note that percocet has 325mg  of tylenol).   Call the urologist today to touch base about your symptoms.   Return if needed.

## 2021-12-27 NOTE — ED Notes (Signed)
Patient transported to CT 

## 2021-12-27 NOTE — ED Triage Notes (Signed)
Lithotripsy completed at [facility] yesterday. Sent home afterwards. Has been taking prescribed pain medications as directed but ~9PM pain became unmanageable.   Vomited meds back up. Reported urine still clear.

## 2021-12-27 NOTE — ED Provider Notes (Signed)
[facility]-EMERGENCY DEPT Provider Note   CSN: [id number] Arrival date & time: 12/26/21  2358     History  Chief Complaint  Patient presents with   Flank Pain    Gregory James is a 61 y.o. male.   Flank Pain  Patient is a 61 year old gentleman with a past medical history significant for numerous episodes of nephrolithiasis  Patient was diagnosed with a 10 mm obstructing left-sided ureteral stone with hydronephrosis and the small bump in creatinine.  He underwent lithotripsy yesterday and states that he felt that he was doing well until later in the evening.  He was the lithotripsy was done sometime around 2 PM  He presents to the emergency room with severe flank pain nausea vomiting and states that his pain is unmanageable.  He states that he has been taking his medications as prescribed to the best of his ability but after he started vomiting he was no longer able to keep down his medications.  No fevers at home.  Patient had lithotripsy      Home Medications Prior to Admission medications   Medication Sig Start Date End Date Taking? Authorizing Provider  ondansetron (ZOFRAN-ODT) 4 MG disintegrating tablet Take 1 tablet (4 mg total) by mouth every 8 (eight) hours as needed for nausea or vomiting. 12/27/21  Yes Yehuda Printup S, PA  acetaminophen (TYLENOL) 500 MG tablet Take 1,000 mg by mouth every 6 (six) hours as needed for mild pain.    [provider]  albuterol (VENTOLIN HFA) 108 (90 Base) MCG/ACT inhaler Inhale 1 puff into the lungs every 6 (six) hours as needed for wheezing or shortness of breath.    [provider]  alum hydroxide-mag trisilicate (GAVISCON) 80-20 MG CHEW chewable tablet Chew 1 tablet by mouth daily as needed for indigestion or heartburn.    [provider]  cetirizine (ZYRTEC) 10 MG tablet Take 10 mg by mouth daily.    [provider]  Cholecalciferol (VITAMIN D3 MAXIMUM STRENGTH) 125 MCG (5000  UT) capsule Take 5,000 Units by mouth daily.    [provider]  clomiPHENE (CLOMID) 50 MG tablet Take 25 mg by mouth daily.    [provider]  mesalamine (CANASA) 1000 MG suppository Place 1,000 mg rectally every other day. 05/30/21   [provider]  mesalamine (LIALDA) 1.2 g EC tablet Take 2.4 g by mouth daily. 06/27/21   [provider]  metoprolol tartrate (LOPRESSOR) 100 MG tablet Take one tablet two hours prior to the test 10/18/21   Croitoru, Mihai, MD  ondansetron (ZOFRAN) 4 MG tablet Take 1 tablet (4 mg total) by mouth every 8 (eight) hours as needed for up to 7 days for nausea or vomiting. 12/20/21 12/27/21  Gowens, Mariah L, PA-C  oxyCODONE (ROXICODONE) 5 MG immediate release tablet Take 1 tablet (5 mg total) by mouth every 6 (six) hours as needed for up to 3 days for severe pain or moderate pain. 12/26/21 12/29/21  Despina AriasMachen, Graham L, MD  rosuvastatin (CRESTOR) 10 MG tablet Take 1 tablet (10 mg total) by mouth daily. 10/18/21 10/13/22  Croitoru, Mihai, MD      Allergies    Etodolac, Lortab [hydrocodone-acetaminophen], Morphine, Sulfa antibiotics, and Penicillins    Review of Systems   Review of Systems  Genitourinary:  Positive for flank pain.    Physical Exam Updated Vital Signs BP 123/79   Pulse 68   Temp 98.2 F (36.8 C)   Resp 18   SpO2  93%  Physical Exam Vitals and nursing note reviewed.  Constitutional:      General: He is in acute distress.  HENT:     Head: Normocephalic and atraumatic.     Nose: Nose normal.  Eyes:     General: No scleral icterus. Cardiovascular:     Rate and Rhythm: Normal rate and regular rhythm.     Pulses: Normal pulses.     Heart sounds: Normal heart sounds.  Pulmonary:     Effort: Pulmonary effort is normal. No respiratory distress.     Breath sounds: No wheezing.  Abdominal:     Palpations: Abdomen is soft.     Tenderness: There is no abdominal tenderness. There is no guarding or rebound.   Musculoskeletal:     Cervical back: Normal range of motion.     Right lower leg: No edema.     Left lower leg: No edema.  Skin:    General: Skin is warm and dry.     Capillary Refill: Capillary refill takes less than 2 seconds.  Neurological:     Mental Status: He is alert. Mental status is at baseline.  Psychiatric:        Mood and Affect: Mood normal.        Behavior: Behavior normal.     ED Results / Procedures / Treatments   Labs (all labs ordered are listed, but only abnormal results are displayed) Labs Reviewed  COMPREHENSIVE METABOLIC PANEL - Abnormal; Notable for the following components:      Result Value   Glucose, Bld 109 (*)    Creatinine, Ser 1.26 (*)    All other components within normal limits  URINALYSIS, ROUTINE W REFLEX MICROSCOPIC - Abnormal; Notable for the following components:   APPearance CLOUDY (*)    Hgb urine dipstick MODERATE (*)    Ketones, ur 20 (*)    Protein, ur 30 (*)    Leukocytes,Ua TRACE (*)    RBC / HPF >50 (*)    Bacteria, UA RARE (*)    All other components within normal limits  CBC    EKG None  Radiology DG Abd 1 View  Result Date: 12/27/2021 CLINICAL DATA:  Known left UPJ stone EXAM: ABDOMEN - 1 VIEW COMPARISON:  CT from 12/20/2021 FINDINGS: Postsurgical changes are noted at L4-5. A triangular shaped 15 mm left UPJ stone is noted. Small nonobstructing left renal calculi are noted as well. Stable 14 mm lower pole right renal stone is noted as well as smaller scattered right renal calculi. No other ureteral stones are seen. IMPRESSION: Stable appearing calculi bilaterally with the left UPJ stone unchanged in position. Electronically Signed   By: Alcide Clever M.D.   On: 12/27/2021 01:40   CT Renal Stone Study  Result Date: 12/27/2021 CLINICAL DATA:  Known left UPJ stone with increasing pain following lithotripsy yesterday EXAM: CT ABDOMEN AND PELVIS WITHOUT CONTRAST TECHNIQUE: Multidetector CT imaging of the abdomen and pelvis was  performed following the standard protocol without IV contrast. RADIATION DOSE REDUCTION: This exam was performed according to the departmental dose-optimization program which includes automated exposure control, adjustment of the mA and/or kV according to patient size and/or use of iterative reconstruction technique. COMPARISON:  12/20/2021, plain film from earlier in the same day. FINDINGS: Lower chest: No acute abnormality. Hepatobiliary: Gallbladder is within normal limits. Hypodense lesion is noted in the dome of the liver laterally similar to that seen on the recent CT examination. No other focal lesion is noted.  Pancreas: Unremarkable. No pancreatic ductal dilatation or surrounding inflammatory changes. Spleen: Normal in size without focal abnormality. Adrenals/Urinary Tract: Adrenal glands are within normal limits. Stable right renal calculi are noted particularly in the lower pole measuring up to 8 mm. Nonobstructing left renal calculi are seen which appears slightly increased in number which may be related to the recent lithotripsy. There remains left-sided hydronephrosis and hydroureter secondary to multiple stone fragments identified within the proximal ureter. These are grouped together and actual size is difficult to assess. They extend for approximately 3 cm in length within the ureter. The more distal left ureter also demonstrates calculi the largest of which measures 7 mm just before the left UVJ. Multiple stone fragments are noted within the bladder as well as 1 which appears to be lodged in the ureterovesical junction (1-2 mm). Bladder is otherwise within normal limits. Stomach/Bowel: Diverticular change of the colon is noted without evidence of diverticulitis. No obstructive changes are seen. The appendix is within normal limits. Small bowel and stomach are unremarkable. Vascular/Lymphatic: Aortic atherosclerosis. No enlarged abdominal or pelvic lymph nodes. Reproductive: Prostate is unremarkable.  Other: No abdominal wall hernia or abnormality. No abdominopelvic ascites. Musculoskeletal: Stable sclerotic focus in the L3 vertebral body. Again correlation with PSA may be helpful. Postsurgical changes in the lower lumbar spine are again noted. IMPRESSION: When compared with the pre lithotripsy examination, there has been fragmentation of the left UPJ stone into multiple smaller fragments which are now identified both within the collecting system of the left kidney as well as several within the proximal ureter as well as distally just above and at the left UVJ. Small fragments are noted within the bladder. Stable right renal calculi without obstructive change. Stable hypodense lesion in the dome of the liver laterally similar to that seen on the prior exam. Again nonemergent MRI is recommended for further evaluation when the patient's condition improves. Electronically Signed   By: Alcide Clever M.D.   On: 12/27/2021 01:39    Procedures Procedures    Medications Ordered in ED Medications  fentaNYL (SUBLIMAZE) injection 75 mcg (75 mcg Intravenous Given 12/27/21 0252)  ondansetron (ZOFRAN) injection 4 mg (4 mg Intravenous Given 12/27/21 0146)  sodium chloride 0.9 % bolus 1,000 mL (0 mLs Intravenous Stopped 12/27/21 0346)  metoCLOPramide (REGLAN) injection 10 mg (10 mg Intravenous Given 12/27/21 0354)  diphenhydrAMINE (BENADRYL) capsule 25 mg (25 mg Oral Given 12/27/21 0353)  oxyCODONE-acetaminophen (PERCOCET/ROXICET) 5-325 MG per tablet 2 tablet (2 tablets Oral Given 12/27/21 0353)    ED Course/ Medical Decision Making/ A&P                           Medical Decision Making Amount and/or Complexity of Data Reviewed Labs: ordered. Radiology: ordered.  Risk Prescription drug management.   This patient presents to the ED for concern of flank pain, this involves a number of treatment options, and is a complaint that carries with it a moderate risk of complications and morbidity. A differential  diagnosis was considered for the patient's symptoms which is discussed below:   The differential diagnosis of emergent flank pain includes, but is not limited to :Abdominal aortic aneurysm,, Renal artery embolism,Renal vein thrombosis, Aortic dissection, Mesenteric ischemia, Pyelonephritis, Renal infarction, Renal hemorrhage, Nephrolithiasis/ Renal Colic, Bladder tumor,Cystitis, Biliary colic, Pancreatitis Perforated peptic ulcer Appendicitis ,Inguinal Hernia, Diverticulitis, Bowel obstruction Testicular torsion,Epididymitis Shingles Lower lobe pneumonia, Retroperitoneal hematoma/abscess/tumor, Epidural abscess, Epidural hematoma    Co morbidities: Discussed in  HPI   Brief History:  Patient is a 61 year old gentleman with a past medical history significant for numerous episodes of nephrolithiasis  Patient was diagnosed with a 10 mm obstructing left-sided ureteral stone with hydronephrosis and the small bump in creatinine.  He underwent lithotripsy yesterday and states that he felt that he was doing well until later in the evening.  He was the lithotripsy was done sometime around 2 PM  He presents to the emergency room with severe flank pain nausea vomiting and states that his pain is unmanageable.  He states that he has been taking his medications as prescribed to the best of his ability but after he started vomiting he was no longer able to keep down his medications.  No fevers at home.  Patient had lithotripsy    EMR reviewed including pt PMHx, past surgical history and past visits to ER.   See HPI for more details   Lab Tests:   I ordered and independently interpreted labs. Labs notable for Creatinine stable at 1.26 no significant change from a week ago.  Electrolytes normal CBC unremarkable urinalysis with moderate hemoglobin consistent with known ureterolithiasis  Imaging Studies:  Abnormal findings. I personally reviewed all imaging studies. Imaging notable  for  IMPRESSION:  When compared with the pre lithotripsy examination, there has been  fragmentation of the left UPJ stone into multiple smaller fragments  which are now identified both within the collecting system of the  left kidney as well as several within the proximal ureter as well as  distally just above and at the left UVJ. Small fragments are noted  within the bladder.    Stable right renal calculi without obstructive change.    Stable hypodense lesion in the dome of the liver laterally similar  to that seen on the prior exam. Again nonemergent MRI is recommended  for further evaluation when the patient's condition improves.    Cardiac Monitoring:  NA NA   Medicines ordered:  I ordered medication including fentanyl, Zofran, 1 L normal saline, Percocet, Benadryl, Reglan for pain nausea vomiting Reevaluation of the patient after these medicines showed that the patient improved I have reviewed the patients home medicines and have made adjustments as needed   Critical Interventions:     Consults/Attending Physician   I discussed this case with my attending physician who cosigned this note including patient's presenting symptoms, physical exam, and planned diagnostics and interventions. Attending physician stated agreement with plan or made changes to plan which were implemented.   Reevaluation:  After the interventions noted above I re-evaluated patient and found that they have :improved   Social Determinants of Health:      Problem List / ED Course:  Ureterolithiasis now passing gravel after lithotripsy.  Tolerating p.o. after antiemetics and states his pain is now 0.  Will discharge home with urology follow-up.  Return precautions discussed   Dispostion:  After consideration of the diagnostic results and the patients response to treatment, I feel that the patent would benefit from Close follow-up with urology.  Return precautions discussed.  Patient is  tolerating p.o. and states his pain is a 0 currently     Final Clinical Impression(s) / ED Diagnoses Final diagnoses:  Ureterolithiasis    Rx / DC Orders ED Discharge Orders          Ordered    ondansetron (ZOFRAN-ODT) 4 MG disintegrating tablet  Every 8 hours PRN        12/27/21 0535  Solon Augusta [place], [place] 12/27/21 5790    Gilda Crease, MD 12/27/21 [phone]

## 2021-12-27 NOTE — ED Provider Triage Note (Signed)
Emergency Medicine Provider Triage Evaluation Note  Gregory James , a 61 y.o. male  was evaluated in triage.  Pt complains of severe flank pain.  Patient had lithotripsy earlier today. 38mm stone prior to LT.   Still urinating.   Review of Systems  Positive: Flank pain Negative: Fever   Physical Exam  BP 129/88   Pulse 86   Temp 98 F (36.7 C) (Oral)   Resp 19   SpO2 99%  Gen:   Awake, very uncomfortable Resp:  Normal effort  MSK:   Moves extremities without difficulty  Other:    Medical Decision Making  Medically screening exam initiated at 12:29 AM.  Appropriate orders placed.  Gregory James was informed that the remainder of the evaluation will be completed by another provider, this initial triage assessment does not replace that evaluation, and the importance of remaining in the ED until their evaluation is complete.  CT renal , repeat labs   Gailen Shelter, [place] 12/27/21 7741

## 2021-12-30 ENCOUNTER — Encounter (HOSPITAL_BASED_OUTPATIENT_CLINIC_OR_DEPARTMENT_OTHER): Payer: Self-pay | Admitting: Urology

## 2022-01-08 DIAGNOSIS — N202 Calculus of kidney with calculus of ureter: Secondary | ICD-10-CM | POA: Diagnosis not present

## 2022-01-10 DIAGNOSIS — H6692 Otitis media, unspecified, left ear: Secondary | ICD-10-CM | POA: Diagnosis not present

## 2022-01-15 DIAGNOSIS — N202 Calculus of kidney with calculus of ureter: Secondary | ICD-10-CM | POA: Diagnosis not present

## 2022-01-20 ENCOUNTER — Other Ambulatory Visit: Payer: Self-pay | Admitting: Urology

## 2022-01-30 NOTE — Progress Notes (Signed)
Left voicemail to return call °

## 2022-01-31 ENCOUNTER — Encounter (HOSPITAL_BASED_OUTPATIENT_CLINIC_OR_DEPARTMENT_OTHER): Payer: Self-pay | Admitting: Urology

## 2022-01-31 NOTE — Progress Notes (Signed)
Talked with patient. Hx and meds reviewed.Instructions given. Wife is the driver. Arrival time 0800

## 2022-02-02 NOTE — H&P (Signed)
H&P  Chief Complaint: Rt renal stone  History of Present Illness: 62 yo male presents for ESL of a 10 mm RLP stone.  Past Medical History:  Diagnosis Date   GERD (gastroesophageal reflux disease)    Renal disorder     Past Surgical History:  Procedure Laterality Date   BACK SURGERY     EXTRACORPOREAL SHOCK WAVE LITHOTRIPSY Left 12/26/2021   Procedure: EXTRACORPOREAL SHOCK WAVE LITHOTRIPSY (ESWL);  Surgeon: Vira Agar, MD;  Location: [facility];  Service: Urology;  Laterality: Left;    Home Medications:  Allergies as of 02/02/2022       Reactions   Etodolac Nausea Only, Other (See Comments)   Caused roaring in head   Lortab [hydrocodone-acetaminophen] Nausea Only   Morphine Nausea Only, Other (See Comments)   Sulfa Antibiotics Hives   Penicillins Rash        Medication List      Notice   Cannot display discharge medications because the patient has not yet been admitted.     Allergies:  Allergies  Allergen Reactions   Etodolac Nausea Only and Other (See Comments)    Caused roaring in head   Lortab [Hydrocodone-Acetaminophen] Nausea Only   Morphine Nausea Only and Other (See Comments)   Sulfa Antibiotics Hives   Penicillins Rash    No family history on file.  Social History:  reports that he has never smoked. He has never used smokeless tobacco. He reports current alcohol use. He reports that he does not use drugs.  ROS: A complete review of systems was performed.  All systems are negative except for pertinent findings as noted.  Physical Exam:  Vital signs in last 24 hours: There were no vitals taken for this visit. Constitutional:  Alert and oriented, No acute distress Cardiovascular: Regular rate  Respiratory: Normal respiratory effort GI: Abdomen is soft, nontender, nondistended, no abdominal masses. No CVAT.  Genitourinary: Normal male phallus, testes are descended bilaterally and non-tender and without masses, scrotum is normal  in appearance without lesions or masses, perineum is normal on inspection. Lymphatic: No lymphadenopathy Neurologic: Grossly intact, no focal deficits Psychiatric: Normal mood and affect  I have reviewed prior pt notes  I have reviewed urinalysis results  I have independently reviewed prior imaging    Impression/Assessment:  Rt lower pole renal calculus   Plan:  ESL. THis is the first part of a possible staged procedure.

## 2022-02-03 ENCOUNTER — Other Ambulatory Visit: Payer: Self-pay

## 2022-02-03 ENCOUNTER — Ambulatory Visit (HOSPITAL_BASED_OUTPATIENT_CLINIC_OR_DEPARTMENT_OTHER)
Admission: RE | Admit: 2022-02-03 | Discharge: 2022-02-03 | Disposition: A | Discharge status: Home / Self Care | Payer: BC Managed Care – PPO | Attending: Urology | Admitting: Urology

## 2022-02-03 ENCOUNTER — Encounter (HOSPITAL_BASED_OUTPATIENT_CLINIC_OR_DEPARTMENT_OTHER)
Admission: RE | Disposition: A | Discharge status: Home / Self Care | Payer: Self-pay | Source: Home / Self Care | Attending: Urology

## 2022-02-03 ENCOUNTER — Encounter (HOSPITAL_BASED_OUTPATIENT_CLINIC_OR_DEPARTMENT_OTHER): Payer: Self-pay | Admitting: Urology

## 2022-02-03 ENCOUNTER — Ambulatory Visit (HOSPITAL_COMMUNITY): Payer: BC Managed Care – PPO

## 2022-02-03 DIAGNOSIS — N200 Calculus of kidney: Secondary | ICD-10-CM | POA: Diagnosis not present

## 2022-02-03 DIAGNOSIS — K589 Irritable bowel syndrome without diarrhea: Secondary | ICD-10-CM | POA: Diagnosis not present

## 2022-02-03 DIAGNOSIS — D869 Sarcoidosis, unspecified: Secondary | ICD-10-CM | POA: Diagnosis not present

## 2022-02-03 DIAGNOSIS — Z9889 Other specified postprocedural states: Secondary | ICD-10-CM | POA: Diagnosis not present

## 2022-02-03 HISTORY — DX: Gastro-esophageal reflux disease without esophagitis: K21.9

## 2022-02-03 HISTORY — PX: EXTRACORPOREAL SHOCK WAVE LITHOTRIPSY: SHX1557

## 2022-02-03 SURGERY — LITHOTRIPSY, ESWL
Anesthesia: LOCAL | Laterality: Right

## 2022-02-03 MED ORDER — DIPHENHYDRAMINE HCL 25 MG PO CAPS
ORAL_CAPSULE | ORAL | Status: AC
  Filled 2022-02-03: qty 1

## 2022-02-03 MED ORDER — DIAZEPAM 5 MG PO TABS
10.0000 mg | ORAL_TABLET | ORAL | Status: AC
  Administered 2022-02-03: 10 mg via ORAL

## 2022-02-03 MED ORDER — CIPROFLOXACIN HCL 500 MG PO TABS
500.0000 mg | ORAL_TABLET | ORAL | Status: AC
  Administered 2022-02-03: 500 mg via ORAL

## 2022-02-03 MED ORDER — SODIUM CHLORIDE 0.9 % IV SOLN: INTRAVENOUS | Status: DC

## 2022-02-03 MED ORDER — CIPROFLOXACIN HCL 500 MG PO TABS
ORAL_TABLET | ORAL | Status: AC
  Filled 2022-02-03: qty 1

## 2022-02-03 MED ORDER — DIAZEPAM 5 MG PO TABS
ORAL_TABLET | ORAL | Status: AC
  Filled 2022-02-03: qty 2

## 2022-02-03 MED ORDER — DIPHENHYDRAMINE HCL 25 MG PO CAPS
25.0000 mg | ORAL_CAPSULE | ORAL | Status: AC
  Administered 2022-02-03: 25 mg via ORAL

## 2022-02-03 NOTE — Discharge Instructions (Addendum)
See [facility] discharge instructions in chart.    Post Anesthesia Home Care Instructions  Activity: Get plenty of rest for the remainder of the day. A responsible individual must stay with you for 24 hours following the procedure.  For the next 24 hours, DO NOT: -Drive a car -Operate machinery -Drink alcoholic beverages -Take any medication unless instructed by your physician -Make any legal decisions or sign important papers.  Meals: Start with liquid foods such as gelatin or soup. Progress to regular foods as tolerated. Avoid greasy, spicy, heavy foods. If nausea and/or vomiting occur, drink only clear liquids until the nausea and/or vomiting subsides. Call your physician if vomiting continues.      

## 2022-02-03 NOTE — Op Note (Signed)
See [facility] OP note scanned into chart. 

## 2022-02-04 ENCOUNTER — Encounter (HOSPITAL_BASED_OUTPATIENT_CLINIC_OR_DEPARTMENT_OTHER): Payer: Self-pay | Admitting: Urology

## 2022-02-17 DIAGNOSIS — N200 Calculus of kidney: Secondary | ICD-10-CM | POA: Diagnosis not present

## 2022-02-24 DIAGNOSIS — E785 Hyperlipidemia, unspecified: Secondary | ICD-10-CM | POA: Diagnosis not present

## 2022-02-24 LAB — LIPID PANEL
Chol/HDL Ratio: 3.3 ratio (ref 0.0–5.0)
Cholesterol, Total: 109 mg/dL (ref 100–199)
HDL: 33 mg/dL — ABNORMAL LOW (ref >39)
LDL Chol Calc (NIH): 54 mg/dL (ref 0–99)
Triglycerides: 122 mg/dL (ref 0–149)
VLDL Cholesterol Cal: 22 mg/dL (ref 5–40)

## 2022-02-24 LAB — HEPATIC FUNCTION PANEL
ALT: 14 IU/L (ref 0–44)
AST: 14 IU/L (ref 0–40)
Albumin: 4.1 g/dL (ref 3.9–4.9)
Alkaline Phosphatase: 53 IU/L (ref 44–121)
Bilirubin Total: 0.5 mg/dL (ref 0.0–1.2)
Bilirubin, Direct: 0.16 mg/dL (ref 0.00–0.40)
Total Protein: 6.1 g/dL (ref 6.0–8.5)

## 2022-04-08 DIAGNOSIS — H9202 Otalgia, left ear: Secondary | ICD-10-CM | POA: Diagnosis not present

## 2022-04-29 DIAGNOSIS — H9012 Conductive hearing loss, unilateral, left ear, with unrestricted hearing on the contralateral side: Secondary | ICD-10-CM | POA: Diagnosis not present

## 2022-04-29 DIAGNOSIS — H9202 Otalgia, left ear: Secondary | ICD-10-CM | POA: Diagnosis not present

## 2022-05-21 DIAGNOSIS — H524 Presbyopia: Secondary | ICD-10-CM | POA: Diagnosis not present

## 2022-05-21 DIAGNOSIS — H5203 Hypermetropia, bilateral: Secondary | ICD-10-CM | POA: Diagnosis not present

## 2022-05-21 DIAGNOSIS — H2513 Age-related nuclear cataract, bilateral: Secondary | ICD-10-CM | POA: Diagnosis not present

## 2022-05-21 DIAGNOSIS — H02402 Unspecified ptosis of left eyelid: Secondary | ICD-10-CM | POA: Diagnosis not present

## 2022-06-24 DIAGNOSIS — K5100 Ulcerative (chronic) pancolitis without complications: Secondary | ICD-10-CM | POA: Diagnosis not present

## 2022-06-24 DIAGNOSIS — Z86010 Personal history of colonic polyps: Secondary | ICD-10-CM | POA: Diagnosis not present

## 2022-06-24 DIAGNOSIS — E669 Obesity, unspecified: Secondary | ICD-10-CM | POA: Diagnosis not present

## 2022-07-22 DIAGNOSIS — Z0000 Encounter for general adult medical examination without abnormal findings: Secondary | ICD-10-CM | POA: Diagnosis not present

## 2022-07-22 DIAGNOSIS — E291 Testicular hypofunction: Secondary | ICD-10-CM | POA: Diagnosis not present

## 2022-07-22 DIAGNOSIS — Z13220 Encounter for screening for lipoid disorders: Secondary | ICD-10-CM | POA: Diagnosis not present

## 2022-07-22 DIAGNOSIS — Z125 Encounter for screening for malignant neoplasm of prostate: Secondary | ICD-10-CM | POA: Diagnosis not present

## 2022-07-25 DIAGNOSIS — Z0000 Encounter for general adult medical examination without abnormal findings: Secondary | ICD-10-CM | POA: Diagnosis not present

## 2022-07-25 DIAGNOSIS — E291 Testicular hypofunction: Secondary | ICD-10-CM | POA: Diagnosis not present

## 2022-08-20 DIAGNOSIS — N200 Calculus of kidney: Secondary | ICD-10-CM | POA: Diagnosis not present

## 2022-09-12 ENCOUNTER — Other Ambulatory Visit: Payer: Self-pay | Admitting: Cardiovascular Disease

## 2022-12-09 ENCOUNTER — Encounter: Payer: Self-pay | Admitting: Cardiovascular Disease

## 2022-12-09 ENCOUNTER — Ambulatory Visit: Payer: BC Managed Care – PPO | Attending: Cardiovascular Disease | Admitting: Cardiovascular Disease

## 2022-12-09 VITALS — BP 120/80 | HR 62 | Ht 74.0 in | Wt 245.0 lb

## 2022-12-09 DIAGNOSIS — R002 Palpitations: Secondary | ICD-10-CM | POA: Diagnosis not present

## 2022-12-09 DIAGNOSIS — E785 Hyperlipidemia, unspecified: Secondary | ICD-10-CM | POA: Diagnosis not present

## 2022-12-09 DIAGNOSIS — I2510 Atherosclerotic heart disease of native coronary artery without angina pectoris: Secondary | ICD-10-CM

## 2022-12-09 NOTE — Progress Notes (Signed)
Cardiology Office Note:    Date:  12/09/2022   ID:  Gregory James, DOB 12-09-1960, MRN [id number]  PCP:  Daisy Floro, MD   [facility] Providers Cardiologist:  Thurmon Fair, MD     Referring MD: Daisy Floro, MD   No chief complaint on file. Gregory James is a 62 y.o. male who is being seen today for the evaluation of Elevated coronary calcium score at the request of Daisy Floro, MD.   History of Present Illness:    Gregory James is a 62 y.o. male with a hx of hypercholesterolemia, ulcerative colitis, sarcoidosis (primarily mediastinal lymphadenopathy), remote history of a single episode of paroxysmal atrial fibrillation, hypotestosteronemia, GERD, moderate asymptomatic coronary artery disease (50-74% stenosis in the proximal LAD artery, mild plaque in the distal left main coronary artery <50% with calcium score at 78th percentile).  He is accompanied by his wife Selena Batten, who is a Engineer, civil (consulting).  In the last 12 months he has not had any cardiac symptoms, but has had problems with calcium oxalate kidney stones for which he had to undergo external wave shock lithotripsy.  He has been physically active.  He enjoys hiking with his wife and has not been limited by shortness of breath or chest discomfort.  He denies palpitations, dizziness and has not had recurrent syncope.  He does not have intermittent claudication, but does have occasional cramps when he stretches in bed at night if he has working hard during the day.  He has chronic mild bilateral ankle edema.   More than 20 years ago he had an episode of atrial fibrillation that was reportedly due to treatment with excessive amounts of over-the-counter Sudafed for nasal congestion and resolved with discontinuation of the medication.  He reports that he underwent work-up at that time with some type of mild valve problem being identified (none of those results are available to [place] at this time).  He takes clomiphene  for hypotestosteronemia.  He is on salicylates for ulcerative colitis.  His father had a myocardial infarction in his early 43s.  Past Medical History:  Diagnosis Date   GERD (gastroesophageal reflux disease)    Renal disorder     Past Surgical History:  Procedure Laterality Date   BACK SURGERY     EXTRACORPOREAL SHOCK WAVE LITHOTRIPSY Left 12/26/2021   Procedure: EXTRACORPOREAL SHOCK WAVE LITHOTRIPSY (ESWL);  Surgeon: Despina Arias, MD;  Location: [facility];  Service: Urology;  Laterality: Left;   EXTRACORPOREAL SHOCK WAVE LITHOTRIPSY Right 02/03/2022   Procedure: RIGHT EXTRACORPOREAL SHOCK WAVE LITHOTRIPSY (ESWL);  Surgeon: Marcine Matar, MD;  Location: [facility];  Service: Urology;  Laterality: Right;    Current Medications: Current Meds  Medication Sig   Cholecalciferol (VITAMIN D3 MAXIMUM STRENGTH) 125 MCG (5000 UT) capsule Take 5,000 Units by mouth daily.   clomiPHENE (CLOMID) 50 MG tablet Take 25 mg by mouth daily.   fexofenadine (ALLEGRA ALLERGY) 180 MG tablet Take 180 mg by mouth daily.   mesalamine (CANASA) 1000 MG suppository Place 1,000 mg rectally every other day.   mesalamine (LIALDA) 1.2 g EC tablet Take 2.4 g by mouth daily.   rosuvastatin (CRESTOR) 10 MG tablet TAKE 1 TABLET BY MOUTH EVERY DAY     Allergies:   Etodolac, Lortab [hydrocodone-acetaminophen], Morphine, Sulfa antibiotics, and Penicillins   Social History   Socioeconomic History   Marital status: Single    Spouse name: Not on file   Number of children:  Not on file   Years of education: Not on file   Highest education level: Not on file  Occupational History   Not on file  Tobacco Use   Smoking status: Never   Smokeless tobacco: Never  Vaping Use   Vaping status: Never Used  Substance and Sexual Activity   Alcohol use: Yes    Comment: rare   Drug use: Never   Sexual activity: Not on file  Other Topics Concern   Not on file  Social History  Narrative   Not on file   Social Determinants of Health   Financial Resource Strain: Not on file  Food Insecurity: Not on file  Transportation Needs: Not on file  Physical Activity: Not on file  Stress: Not on file  Social Connections: Not on file     Family History: The patient's father had myocardial infarction at age 14-65  ROS:   Please see the history of present illness.     All other systems reviewed and are negative.  EKGs/Labs/Other Studies Reviewed:    The following studies were reviewed today: Coronary calcium score 08/29/2021 Score 517 (86th percentile), ascending aorta 3.8 cm, scattered patchy bilateral parenchymal lung densities some nodular, largest measuring 6 mm Notes and labs from Dr. Tenny Craw  EKG:    EKG Interpretation Date/Time:  Tuesday December 09 2022 11:27:09 EST Ventricular Rate:  62 PR Interval:  174 QRS Duration:  90 QT Interval:  420 QTC Calculation: 426 R Axis:   24  Text Interpretation: Normal sinus rhythm Normal ECG When compared with ECG of 13-Oct-2021 10:28, No significant change was found Confirmed by Shelbe Haglund ([id number]) on 12/09/2022 11:50:33 AM         Recent Labs: 12/27/2021: BUN 18; Creatinine, Ser 1.26; Hemoglobin 14.9; Platelets 185; Potassium 3.8; Sodium 137 02/24/2022: ALT 14  Recent Lipid Panel    Component Value Date/Time   CHOL 109 02/24/2022 0935   TRIG 122 02/24/2022 0935   HDL 33 (L) 02/24/2022 0935   CHOLHDL 3.3 02/24/2022 0935   LDLCALC 54 02/24/2022 0935   06/27/2021 Cholesterol 181, HDL 33, LDL 116, triglycerides 181  07/22/2022 Cholesterol 136, HDL 38, LDL 75, triglycerides 125.  Risk Assessment/Calculations:                Physical Exam:    VS:  BP 120/80 (BP Location: Left Arm, Patient Position: Sitting, Cuff Size: Large)   Pulse 62   Ht 6\' 2"  (1.88 m)   Wt 245 lb (111.1 kg)   BMI 31.46 kg/m     Wt Readings from Last 3 Encounters:  12/09/22 245 lb (111.1 kg)  02/03/22 242 lb 11.2 oz  (110.1 kg)  12/26/21 252 lb (114.3 kg)      General: Alert, oriented x3, no distress, mildly obese Head: no evidence of trauma, PERRL, EOMI, no exophtalmos or lid lag, no myxedema, no xanthelasma; normal ears, nose and oropharynx Neck: normal jugular venous pulsations and no hepatojugular reflux; brisk carotid pulses without delay and no carotid bruits Chest: clear to auscultation, no signs of consolidation by percussion or palpation, normal fremitus, symmetrical and full respiratory excursions Cardiovascular: normal position and quality of the apical impulse, regular rhythm, normal first and second heart sounds, no murmurs, rubs or gallops Abdomen: no tenderness or distention, no masses by palpation, no abnormal pulsatility or arterial bruits, normal bowel sounds, no hepatosplenomegaly Extremities: no clubbing, cyanosis or edema; 2+ radial, ulnar and brachial pulses bilaterally; 2+ right femoral, posterior tibial and dorsalis pedis  pulses; 2+ left femoral, posterior tibial and dorsalis pedis pulses; no subclavian or femoral bruits Neurological: grossly nonfocal Psych: Normal mood and affect   ASSESSMENT:    1. Atherosclerosis of native coronary artery of native heart without angina pectoris   2. Dyslipidemia (high LDL; low HDL)   3. Palpitations    PLAN:    In order of problems listed above:  CAD: Asymptomatic despite being very physically active.  On lipid-lowering therapy. HLP: He has made a little bit of progress with weight loss and his HDL is slightly better.  His LDL was initially in target range on rosuvastatin at 54, on the more recent labs are slightly higher at 75.  No changes made to medications today.  Mild obesity:  Encouraged continued efforts to lose weight.  BMI has decreased from 35-31. Palpitations: Have not bothered him recently reportedly he has a remote history of atrial fibrillation.  At this point CHA2DS2-VASc score is 1 (CAD) and even if atrial fibrillation is  identified I do not think we would be compelled to start anticoagulation.  I have recommended purchasing a commercially available rhythm monitor such as a Kardia or smart watch so we can see what causes his palpitations.. Sarcoidosis: This appears to be quiescent for the last 10 years or so.  This primarily manifested with mediastinal lymphadenopathy.  Has a few scattered small nodules in the lung fields that are probably healed granuloma.  At this point no reason to suspect cardiac sarcoidosis. Ulcerative colitis: On salicylates.  Appears well compensated at this time Bilateral ankle edema: Mild.  Advised avoiding sodium rich foods.  Not sure if this is related to his medications in any way (clomiphene and salicylates).  No other clinical findings to suggest congestive heart failure.   Hypotestosteronism: Treated with clomiphene Nephrolithiasis: With calcium oxalate stones           Medication Adjustments/Labs and Tests Ordered: Current medicines are reviewed at length with the patient today.  Concerns regarding medicines are outlined above.  Orders Placed This Encounter  Procedures   EKG 12-Lead   No orders of the defined types were placed in this encounter.   Patient Instructions  Medication Instructions:  No changes *If you need a refill on your cardiac medications before your next appointment, please call your pharmacy*  Follow-Up: At [facility], you and your health needs are our priority.  As part of our continuing mission to provide you with exceptional heart care, we have created designated Provider Care Teams.  These Care Teams include your primary Cardiologist (physician) and Advanced Practice Providers (APPs -  Physician Assistants and Nurse Practitioners) who all work together to provide you with the care you need, when you need it.  We recommend signing up for the patient portal called "MyChart".  Sign up information is provided on this After Visit Summary.   MyChart is used to connect with patients for Virtual Visits (Telemedicine).  Patients are able to view lab/test results, encounter notes, upcoming appointments, etc.  Non-urgent messages can be sent to your provider as well.   To learn more about what you can do with MyChart, go to [email or web address].    Your next appointment:   1 year(s)  Provider:   Thurmon Fair, MD       Signed, Thurmon Fair, MD  12/09/2022 11:54 AM    [facility]

## 2022-12-09 NOTE — Patient Instructions (Signed)

## 2023-10-15 ENCOUNTER — Other Ambulatory Visit: Payer: Self-pay | Admitting: Cardiovascular Disease

## 2024-01-19 ENCOUNTER — Other Ambulatory Visit: Payer: Self-pay | Admitting: Cardiovascular Disease

## 2024-01-28 ENCOUNTER — Telehealth: Payer: Self-pay | Admitting: Cardiovascular Disease

## 2024-01-28 NOTE — Telephone Encounter (Signed)
" °*  STAT* If patient is at the pharmacy, call can be transferred to refill team.   1. Which medications need to be refilled? (please list name of each medication and dose if known)   rosuvastatin  (CRESTOR ) 10 MG tablet    2. Which pharmacy/location (including street and city if local pharmacy) is medication to be sent to? [facility] - [place], [place] - [address]   3. Do they need a 30 day or 90 day supply? 90  Please include patient email on the prescription is needed for the pharmacy. ([email or web address]  "

## 2024-02-03 MED ORDER — ROSUVASTATIN CALCIUM 10 MG PO TABS: 10.0000 mg | ORAL_TABLET | Freq: Every day | ORAL | 0 refills | Status: AC

## 2024-02-03 NOTE — Telephone Encounter (Signed)
 Pt scheduled 04/20/24, sent in refill to get pt to the next appointment.

## 2024-02-03 NOTE — Addendum Note (Signed)
 Addended by: MEMORY DELON POUR on: 02/03/2024 07:53 AM   Modules accepted: Orders

## 2024-04-20 ENCOUNTER — Ambulatory Visit: Admitting: Cardiovascular Disease
